# Patient Record
Sex: Female | Born: 1970 | Race: Black or African American | Hispanic: No | Marital: Single | Smoking: Never smoker
Health system: Southern US, Community
[De-identification: ages and names within clinical notes are randomized; demographics above are authoritative.]

## PROBLEM LIST (undated history)

## (undated) DIAGNOSIS — G43909 Migraine, unspecified, not intractable, without status migrainosus: Secondary | ICD-10-CM

## (undated) DIAGNOSIS — R569 Unspecified convulsions: Secondary | ICD-10-CM

## (undated) DIAGNOSIS — M359 Systemic involvement of connective tissue, unspecified: Secondary | ICD-10-CM

## (undated) DIAGNOSIS — M329 Systemic lupus erythematosus, unspecified: Secondary | ICD-10-CM

## (undated) DIAGNOSIS — D573 Sickle-cell trait: Secondary | ICD-10-CM

## (undated) DIAGNOSIS — IMO0002 Reserved for concepts with insufficient information to code with codable children: Secondary | ICD-10-CM

## (undated) DIAGNOSIS — E785 Hyperlipidemia, unspecified: Secondary | ICD-10-CM

## (undated) DIAGNOSIS — D649 Anemia, unspecified: Secondary | ICD-10-CM

## (undated) DIAGNOSIS — K589 Irritable bowel syndrome without diarrhea: Secondary | ICD-10-CM

## (undated) HISTORY — DX: Unspecified convulsions: R56.9

## (undated) HISTORY — PX: SMALL INTESTINE SURGERY: SHX150

## (undated) HISTORY — DX: Hyperlipidemia, unspecified: E78.5

## (undated) HISTORY — PX: ABDOMINAL HYSTERECTOMY: SHX81

## (undated) HISTORY — PX: ABDOMINAL SURGERY: SHX537

## (undated) HISTORY — PX: ABDOMINAL HYSTERECTOMY: SUR658

---

## 1989-08-29 HISTORY — PX: CHOLECYSTECTOMY: SHX55

## 2013-08-13 ENCOUNTER — Emergency Department (INDEPENDENT_AMBULATORY_CARE_PROVIDER_SITE_OTHER)
Admission: EM | Admit: 2013-08-13 | Discharge: 2013-08-13 | Disposition: A | Discharge status: Home / Self Care | Payer: Self-pay | Source: Home / Self Care | Attending: Family Medicine | Admitting: Family Medicine

## 2013-08-13 ENCOUNTER — Encounter (HOSPITAL_COMMUNITY): Payer: Self-pay | Admitting: Emergency Medicine

## 2013-08-13 DIAGNOSIS — M79602 Pain in left arm: Secondary | ICD-10-CM

## 2013-08-13 DIAGNOSIS — R109 Unspecified abdominal pain: Secondary | ICD-10-CM

## 2013-08-13 DIAGNOSIS — M79609 Pain in unspecified limb: Secondary | ICD-10-CM

## 2013-08-13 LAB — POCT URINALYSIS DIP (DEVICE)
Ketones, ur: NEGATIVE mg/dL
Leukocytes, UA: NEGATIVE
Protein, ur: NEGATIVE mg/dL
Specific Gravity, Urine: 1.02 (ref 1.005–1.030)
Urobilinogen, UA: 0.2 mg/dL (ref 0.0–1.0)
pH: 6 (ref 5.0–8.0)

## 2013-08-13 NOTE — ED Notes (Signed)
C/o left sided flank pain and headache x 3 wks.  Pt states that she felt like the flank pain was from gas. Pt has tried gas pills with short term relief but flank pain returned.  Denies fever, n/v/d

## 2013-08-13 NOTE — ED Provider Notes (Signed)
Medical screening examination/treatment/procedure(s) were performed by resident physician or non-physician practitioner and as supervising physician I was immediately available for consultation/collaboration.   Llewellyn Choplin DOUGLAS MD.   Eylin Pontarelli D Ahja Martello, MD 08/13/13 2015 
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
## 2013-08-14 ENCOUNTER — Emergency Department (HOSPITAL_COMMUNITY): Payer: Self-pay

## 2013-08-14 ENCOUNTER — Encounter (HOSPITAL_COMMUNITY): Payer: Self-pay | Admitting: Emergency Medicine

## 2013-08-14 ENCOUNTER — Emergency Department (HOSPITAL_COMMUNITY)
Admission: EM | Admit: 2013-08-14 | Discharge: 2013-08-14 | Disposition: A | Discharge status: Home / Self Care | Payer: Self-pay | Attending: Emergency Medicine | Admitting: Emergency Medicine

## 2013-08-14 DIAGNOSIS — R109 Unspecified abdominal pain: Secondary | ICD-10-CM | POA: Insufficient documentation

## 2013-08-14 DIAGNOSIS — M79609 Pain in unspecified limb: Secondary | ICD-10-CM | POA: Insufficient documentation

## 2013-08-14 DIAGNOSIS — Z9089 Acquired absence of other organs: Secondary | ICD-10-CM | POA: Insufficient documentation

## 2013-08-14 DIAGNOSIS — Z3202 Encounter for pregnancy test, result negative: Secondary | ICD-10-CM | POA: Insufficient documentation

## 2013-08-14 DIAGNOSIS — Z862 Personal history of diseases of the blood and blood-forming organs and certain disorders involving the immune mechanism: Secondary | ICD-10-CM | POA: Insufficient documentation

## 2013-08-14 DIAGNOSIS — R51 Headache: Secondary | ICD-10-CM | POA: Insufficient documentation

## 2013-08-14 LAB — URINALYSIS, ROUTINE W REFLEX MICROSCOPIC
Bilirubin Urine: NEGATIVE
Glucose, UA: NEGATIVE mg/dL
Ketones, ur: NEGATIVE mg/dL
Leukocytes, UA: NEGATIVE
Protein, ur: NEGATIVE mg/dL
Urobilinogen, UA: 0.2 mg/dL (ref 0.0–1.0)

## 2013-08-14 LAB — CBC WITH DIFFERENTIAL/PLATELET
Basophils Absolute: 0 10*3/uL (ref 0.0–0.1)
Basophils Relative: 1 % (ref 0–1)
Eosinophils Absolute: 0.2 10*3/uL (ref 0.0–0.7)
Eosinophils Relative: 3 % (ref 0–5)
HCT: 34.7 % — ABNORMAL LOW (ref 36.0–46.0)
Lymphocytes Relative: 27 % (ref 12–46)
Lymphs Abs: 1.6 10*3/uL (ref 0.7–4.0)
MCH: 25.1 pg — ABNORMAL LOW (ref 26.0–34.0)
MCHC: 33.1 g/dL (ref 30.0–36.0)
MCV: 75.8 fL — ABNORMAL LOW (ref 78.0–100.0)
Monocytes Absolute: 0.4 10*3/uL (ref 0.1–1.0)
Monocytes Relative: 7 % (ref 3–12)
Neutrophils Relative %: 63 % (ref 43–77)
Platelets: 338 10*3/uL (ref 150–400)
RDW: 14.7 % (ref 11.5–15.5)
WBC: 6.1 10*3/uL (ref 4.0–10.5)

## 2013-08-14 LAB — URINE MICROSCOPIC-ADD ON

## 2013-08-14 LAB — BASIC METABOLIC PANEL
CO2: 26 mEq/L (ref 19–32)
Calcium: 9.1 mg/dL (ref 8.4–10.5)
Creatinine, Ser: 0.53 mg/dL (ref 0.50–1.10)
GFR calc non Af Amer: >90 mL/min (ref >90)
Sodium: 138 mEq/L (ref 135–145)

## 2013-08-14 LAB — PREGNANCY, URINE: Preg Test, Ur: NEGATIVE

## 2013-08-14 MED ORDER — FENTANYL CITRATE 0.05 MG/ML IJ SOLN
100.0000 ug | INTRAMUSCULAR | Status: DC | PRN
  Administered 2013-08-14: 100 ug via INTRAVENOUS
  Filled 2013-08-14: qty 2

## 2013-08-14 MED ORDER — HYDROCODONE-ACETAMINOPHEN 5-325 MG PO TABS: 1.0000 | ORAL_TABLET | ORAL | Status: DC | PRN

## 2013-08-14 MED ORDER — TOPIRAMATE 25 MG PO TABS: 25.0000 mg | ORAL_TABLET | Freq: Every day | ORAL | Status: DC

## 2013-08-14 MED ORDER — SODIUM CHLORIDE 0.9 % IV SOLN
INTRAVENOUS | Status: DC
  Administered 2013-08-14: 10:00:00 via INTRAVENOUS

## 2013-08-14 NOTE — ED Provider Notes (Signed)
CSN: [id number]     Arrival date & time 08/14/13  0818 History     First MD Initiated Contact with Patient 08/14/13 [phone]       Chief Complaint   Patient presents with   .  Hip Pain   .  Arm Pain   .  Headache      (Consider location/radiation/quality/duration/timing/severity/associated sxs/prior Treatment) HPI Comments: Rachel Hammond is a 42 y.o. female who is here for evaluation of left flank pain, and headache. Both problems have been present for 3 weeks. Both problems are intermittent. She was evaluated yesterday at the urgent care and told to come here, however, she decided to leave AGAINST MEDICAL ADVICE and to  return here today. The pain waxes and wanes. It is present almost all the time she is awake. She is using over-the-counter Aleve, without relief. She denies dysuria, urinary frequency, fever, chills, nausea, vomiting, anorexia, cough, shortness of breath, or chest pain. There are no other known modifying factors.     Patient is a 42 y.o. female presenting with hip pain, arm pain, and headaches. The history is provided by the patient.  Hip Pain Associated symptoms include headaches.  Arm Pain Associated symptoms include headaches.  Headache      Past Medical History   Diagnosis  Date   .  Sickle cell anemia         Past Surgical History   Procedure  Laterality  Date   .  Cholecystectomy       .  Cesarean section          History reviewed. No pertinent family history. History   Substance Use Topics   .  Smoking status:  Never Smoker    .  Smokeless tobacco:  Not on file   .  Alcohol Use:  No       OB History     Grav  Para  Term  Preterm  Abortions  TAB  SAB  Ect  Mult  Living                                Review of Systems  Neurological: Positive for headaches.  All other systems reviewed and are negative.        Allergies    Codeine    Home Medications       Current Outpatient Rx   Name    Route    Sig    Dispense    Refill   .   naproxen sodium (ANAPROX) 220 MG tablet     Oral     Take 440 mg by mouth every 6 (six) hours as needed (for pain).                Marland Kitchen  HYDROcodone-acetaminophen (NORCO) 5-325 MG per tablet     Oral     Take 1 tablet by mouth every 4 (four) hours as needed.     20 tablet     0       BP 95/64  Pulse 58  Temp(Src) 98.3 F (36.8 C) (Oral)  Resp 16  SpO2 100%  LMP 08/03/2013 Physical Exam  Nursing note and vitals reviewed. Constitutional: She is oriented to person, place, and time. She appears well-developed and well-nourished.  HENT:   Head: Normocephalic and atraumatic.  Right Ear: External ear normal.  Left Ear: External ear normal.  Eyes: Conjunctivae and EOM are normal. Pupils  are equal, round, and reactive to light.  Neck: Normal range of motion and phonation normal. Neck supple.  Cardiovascular: Normal rate, regular rhythm, normal heart sounds and intact distal pulses.   Pulmonary/Chest: Effort normal and breath sounds normal. She exhibits no bony tenderness.  Abdominal: Soft. Normal appearance. There is mild left upper and lower quadrant tenderness. There is no rebound tenderness, mass or guarding  Musculoskeletal: Normal range of motion.  Neurological: She is alert and oriented to person, place, and time. No cranial nerve deficit or sensory deficit. She exhibits normal muscle tone. Coordination normal.  Skin: Skin is warm, dry and intact.  Psychiatric: She has a normal mood and affect. Her behavior is normal. Judgment and thought content normal.       ED Course    Medications  0.9 %  sodium chloride infusion ( Intravenous Stopped 08/14/13 1516)  fentaNYL (SUBLIMAZE) injection 100 mcg (100 mcg Intravenous Given 08/14/13 0951)    Patient Vitals for the past 24 hrs:  BP Temp Temp src Pulse Resp SpO2  08/14/13 1513 95/64 mmHg - - 58 - 100 %  08/14/13 1227 96/64 mmHg - - 64 16 100 %  08/14/13 1122 93/77 mmHg - - 56 15 100 %  08/14/13 0826 95/59 mmHg 98.3 F (36.8  C) Oral 69 20 100 %   3:15 PM Reevaluation with update and discussion. After initial assessment and treatment, an updated evaluation reveals She is comfortable now. [name] L   Procedures (including critical care time) Labs Review Labs Reviewed   CBC WITH DIFFERENTIAL - Abnormal; Notable for the following:      Hemoglobin  11.5 (*)       HCT  34.7 (*)       MCV  75.8 (*)       MCH  25.1 (*)       All other components within normal limits   URINALYSIS, ROUTINE W REFLEX MICROSCOPIC - Abnormal; Notable for the following:      APPearance  CLOUDY (*)       Hgb urine dipstick  TRACE (*)       All other components within normal limits   URINE MICROSCOPIC-ADD ON - Abnormal; Notable for the following:      Squamous Epithelial / LPF  FEW (*)       All other components within normal limits   BASIC METABOLIC PANEL   PREGNANCY, URINE      Imaging Review Ct Abdomen Pelvis Wo Contrast   08/14/2013   CLINICAL DATA:  Left-sided flank pain  EXAM: CT ABDOMEN AND PELVIS WITHOUT CONTRAST  TECHNIQUE: Multidetector CT imaging of the abdomen and pelvis was performed following the standard protocol without intravenous contrast.  COMPARISON:  None.  FINDINGS: The lung bases are free of acute infiltrate or sizable effusion.  The gallbladder is been surgically removed. The liver, spleen, adrenal glands and pancreas are all normal in their CT appearance. The kidneys are well visualized bilaterally without evidence of renal calculi or urinary tract obstructive changes. The appendix is not well visualized although no right lower quadrant inflammatory changes to suggest appendicitis are noted. The bladder is partially distended with non-opacified urine. The uterus appears within normal limits. A left-sided ovarian cyst is seen which measures 2.7 cm in greatest dimension. No free pelvic fluid is noted. No other focal abnormality is seen.  IMPRESSION: Left ovarian cyst without complicating factors.   Nonvisualization of the appendix although no inflammatory changes are seen.  No other focal abnormality  is noted.   Electronically Signed   By: Alcide Clever M.D.   On: 08/14/2013 14:00        EKG Interpretation       Date/Time:                  Wednesday August 14 2013 08:20:27 EST Ventricular Rate:         67 PR Interval:                 160 QRS Duration:            72 QT Interval:                 398 QTC Calculation:        420 R Axis:                         60 Text Interpretation:       Normal sinus rhythm Possible Left atrial enlargement Septal infarct , age undetermined Abnormal ECG No old tracing to compare Confirmed by [facility]  MD, Brae Gartman ([id number]) on 08/14/2013 8:28:41 AM                       MDM       1.  Flank pain       Nonspecific flank pain with negative EP evaluation. Doubt urinary tract infection, pyelonephritis, nephrolithiasis, ureteral colic, serious bacterial infection, or metabolic instability  Nursing Notes Reviewed/ Care Coordinated, and agree without changes. Applicable Imaging Reviewed.  Interpretation of Laboratory Data incorporated into ED treatment    Plan: Home Medications- Norco; Home Treatments and Observation- rest; return here if the recommended treatment, does not improve the symptoms; Recommended follow up- PCP of choice for f/u care in 1-2 weeks     After D/C, She requested a refill on her Topamax, 100mg  BID, as prophylaxis, for headaches. Prior doses. She stopped taking 6 months ago. She does not have a doctor yet in [place]. I consented to give her a prescription for Topamax 25 mg, #10 to take at bedtime. She will need to followup with primary care doctor or neurologist, for further prophylactic treatment for headaches.      Flint Melter, MD 08/14/13 1600

## 2013-08-14 NOTE — ED Notes (Signed)
Pt c/o left flank pain into left hip and left arm pain with HA x 3 weeks

## 2013-08-14 NOTE — ED Notes (Signed)
Pt getting undressed and into a gown at this time; 2 blankets given

## 2013-08-14 NOTE — ED Notes (Signed)
Pt up ambulatory to the bathroom at this time attempting to provide an urine specimen 

## 2013-08-14 NOTE — ED Notes (Signed)
Dr Effie Shy made aware of delay of CT. Urine preg resulted as negative.

## 2013-08-14 NOTE — ED Notes (Addendum)
Was seen at [facility] yesterday -- has had left flank pain radiating to left lower abd-- had U/A yestereday and was told to come to ED-- was unable to wait, came back today.  Recently moved here [place], has no private MD--looking for primary care doctor-- has had hx of migraines on topamax, is out of topamax--having headaches currently. Started new job, under a lot of stress per patient.

## 2013-08-14 NOTE — ED Notes (Signed)
Pt made aware of need of urine specimen; pt cannot go at this time

## 2013-09-27 ENCOUNTER — Encounter (HOSPITAL_COMMUNITY): Payer: Self-pay | Admitting: Emergency Medicine

## 2013-09-27 ENCOUNTER — Emergency Department (HOSPITAL_COMMUNITY)
Admission: EM | Admit: 2013-09-27 | Discharge: 2013-09-27 | Disposition: A | Discharge status: Home / Self Care | Payer: Medicaid Other | Attending: Emergency Medicine | Admitting: Emergency Medicine

## 2013-09-27 DIAGNOSIS — Z79899 Other long term (current) drug therapy: Secondary | ICD-10-CM | POA: Insufficient documentation

## 2013-09-27 DIAGNOSIS — R51 Headache: Secondary | ICD-10-CM | POA: Insufficient documentation

## 2013-09-27 DIAGNOSIS — Z862 Personal history of diseases of the blood and blood-forming organs and certain disorders involving the immune mechanism: Secondary | ICD-10-CM | POA: Insufficient documentation

## 2013-09-27 DIAGNOSIS — R519 Headache, unspecified: Secondary | ICD-10-CM

## 2013-09-27 MED ORDER — DIPHENHYDRAMINE HCL 50 MG/ML IJ SOLN
25.0000 mg | Freq: Once | INTRAMUSCULAR | Status: AC
  Administered 2013-09-27: 25 mg via INTRAVENOUS
  Filled 2013-09-27: qty 1

## 2013-09-27 MED ORDER — SODIUM CHLORIDE 0.9 % IV SOLN
Freq: Once | INTRAVENOUS | Status: AC
  Administered 2013-09-27: 21:00:00 via INTRAVENOUS

## 2013-09-27 MED ORDER — KETOROLAC TROMETHAMINE 60 MG/2ML IM SOLN: 60.0000 mg | Freq: Once | INTRAMUSCULAR | Status: DC

## 2013-09-27 MED ORDER — DIPHENHYDRAMINE HCL 50 MG/ML IJ SOLN: 25.0000 mg | Freq: Once | INTRAMUSCULAR | Status: DC

## 2013-09-27 MED ORDER — METOCLOPRAMIDE HCL 5 MG/ML IJ SOLN
10.0000 mg | Freq: Once | INTRAMUSCULAR | Status: AC
Start: 2013-09-27 — End: 2013-09-27
  Administered 2013-09-27: 10 mg via INTRAVENOUS
  Filled 2013-09-27: qty 2

## 2013-09-27 MED ORDER — METOCLOPRAMIDE HCL 5 MG/ML IJ SOLN: 10.0000 mg | Freq: Once | INTRAMUSCULAR | Status: DC

## 2013-09-27 MED ORDER — KETOROLAC TROMETHAMINE 30 MG/ML IJ SOLN
30.0000 mg | Freq: Once | INTRAMUSCULAR | Status: AC
Start: 2013-09-27 — End: 2013-09-27
  Administered 2013-09-27: 30 mg via INTRAVENOUS
  Filled 2013-09-27: qty 1

## 2013-09-27 MED ORDER — SUMATRIPTAN SUCCINATE 100 MG PO TABS: 100.0000 mg | ORAL_TABLET | ORAL | Status: DC | PRN

## 2013-09-27 NOTE — ED Provider Notes (Signed)
CSN: [id number]     Arrival date & time 09/27/13  [id number] History   First MD Initiated Contact with Patient 09/27/13 1954     Chief Complaint  Patient presents with  . Headache   (Consider location/radiation/quality/duration/timing/severity/associated sxs/prior Treatment) Patient is a 43 y.o. female presenting with headaches. The history is provided by the patient.  Headache Pain location:  Frontal Quality:  Sharp Radiates to:  L neck and R neck Onset quality:  Gradual Duration:  3 days Timing:  Constant Progression:  Worsening Chronicity:  New Similar to prior headaches: no   Relieved by:  Nothing Worsened by:  Nothing tried Ineffective treatments:  Prescription medications Risk factors: no anger   Pt complains of a headache.   Pt has had headaches since 08.   Pt reports headache are infrequent.   Pt reports topamax no longer works  Past Medical History  Diagnosis Date  . Sickle cell anemia    Past Surgical History  Procedure Laterality Date  . Cholecystectomy    . Cesarean section     History reviewed. No pertinent family history. History  Substance Use Topics  . Smoking status: Never Smoker   . Smokeless tobacco: Not on file  . Alcohol Use: No   OB History   Grav Para Term Preterm Abortions TAB SAB Ect Mult Living                 Review of Systems  Neurological: Positive for headaches.  All other systems reviewed and are negative.    Allergies  Codeine  Home Medications   Current Outpatient Rx  Name  Route  Sig  Dispense  Refill  . HYDROcodone-acetaminophen (NORCO) 5-325 MG per tablet   Oral   Take 1 tablet by mouth every 4 (four) hours as needed.   20 tablet   0   . naproxen sodium (ANAPROX) 220 MG tablet   Oral   Take 440 mg by mouth every 6 (six) hours as needed (for pain).         . topiramate (TOPAMAX) 25 MG tablet   Oral   Take 1 tablet (25 mg total) by mouth at bedtime.   10 tablet   0    BP 104/72  Pulse 72  Temp(Src) 97.9 F  (36.6 C) (Oral)  Resp 20  Wt 132 lb 8 oz (60.102 kg)  SpO2 99%  LMP 09/27/2013 Physical Exam  Vitals reviewed. Constitutional: She is oriented to person, place, and time. She appears well-developed and well-nourished.  HENT:  Head: Normocephalic.  Right Ear: External ear normal.  Left Ear: External ear normal.  Nose: Nose normal.  Mouth/Throat: Oropharynx is clear and moist.  Eyes: Pupils are equal, round, and reactive to light.  Neck: Normal range of motion. Neck supple.  Cardiovascular: Normal rate and normal heart sounds.   Pulmonary/Chest: Effort normal and breath sounds normal.  Abdominal: Soft. Bowel sounds are normal.  Musculoskeletal: Normal range of motion.  Neurological: She is alert and oriented to person, place, and time. She has normal reflexes.  Skin: Skin is warm.  Psychiatric: She has a normal mood and affect.    ED Course  Procedures (including critical care time) Labs Review Labs Reviewed - No data to display Imaging Review No results found.  EKG Interpretation   None       MDM Rx for imitrex   1. Headache    Pt given Iv fluids, torodol, reglan and benadryl.   Pt reports headache completely resolved  Lonia Skinner [place], PA-C 09/27/13 2232

## 2013-09-27 NOTE — ED Notes (Signed)
Pt reports she developed a headache about 3 days ago that has been constant. Reports she has been taking Naproxen without any relief. Denies any nausea/vomiting.

## 2013-09-27 NOTE — Discharge Instructions (Signed)

## 2013-09-29 NOTE — ED Provider Notes (Signed)
Medical screening examination/treatment/procedure(s) were performed by non-physician practitioner and as supervising physician I was immediately available for consultation/collaboration.  EKG Interpretation   None        Juliet RudeNathan R. Rubin PayorPickering, MD 09/29/13 [phone]

## 2013-11-17 ENCOUNTER — Emergency Department (HOSPITAL_COMMUNITY)
Admission: EM | Admit: 2013-11-17 | Discharge: 2013-11-18 | Disposition: A | Discharge status: Home / Self Care | Payer: Medicaid Other | Attending: Emergency Medicine | Admitting: Emergency Medicine

## 2013-11-17 ENCOUNTER — Encounter (HOSPITAL_COMMUNITY): Payer: Self-pay | Admitting: Emergency Medicine

## 2013-11-17 ENCOUNTER — Emergency Department (HOSPITAL_COMMUNITY)
Admission: EM | Admit: 2013-11-17 | Discharge: 2013-11-17 | Disposition: A | Discharge status: Nursing Home (Includes ICF) | Payer: Medicaid Other | Source: Home / Self Care

## 2013-11-17 DIAGNOSIS — M542 Cervicalgia: Secondary | ICD-10-CM

## 2013-11-17 DIAGNOSIS — R51 Headache: Secondary | ICD-10-CM | POA: Insufficient documentation

## 2013-11-17 DIAGNOSIS — J3489 Other specified disorders of nose and nasal sinuses: Secondary | ICD-10-CM | POA: Insufficient documentation

## 2013-11-17 DIAGNOSIS — Z87828 Personal history of other (healed) physical injury and trauma: Secondary | ICD-10-CM | POA: Insufficient documentation

## 2013-11-17 DIAGNOSIS — R519 Headache, unspecified: Secondary | ICD-10-CM

## 2013-11-17 DIAGNOSIS — M549 Dorsalgia, unspecified: Secondary | ICD-10-CM

## 2013-11-17 DIAGNOSIS — Z862 Personal history of diseases of the blood and blood-forming organs and certain disorders involving the immune mechanism: Secondary | ICD-10-CM | POA: Insufficient documentation

## 2013-11-17 DIAGNOSIS — R291 Meningismus: Secondary | ICD-10-CM

## 2013-11-17 DIAGNOSIS — Z79899 Other long term (current) drug therapy: Secondary | ICD-10-CM | POA: Insufficient documentation

## 2013-11-17 HISTORY — DX: Sickle-cell trait: D57.3

## 2013-11-17 HISTORY — DX: Anemia, unspecified: D64.9

## 2013-11-17 MED ORDER — METOCLOPRAMIDE HCL 5 MG/ML IJ SOLN: 10.0000 mg | Freq: Once | INTRAMUSCULAR | Status: DC

## 2013-11-17 MED ORDER — HYDROCODONE-ACETAMINOPHEN 5-325 MG PO TABS: ORAL_TABLET | ORAL | Status: DC

## 2013-11-17 MED ORDER — HYDROCODONE-ACETAMINOPHEN 5-325 MG PO TABS
2.0000 | ORAL_TABLET | Freq: Once | ORAL | Status: AC
  Administered 2013-11-17: 2 via ORAL
  Filled 2013-11-17: qty 2

## 2013-11-17 MED ORDER — METHOCARBAMOL 500 MG PO TABS: 1000.0000 mg | ORAL_TABLET | Freq: Four times a day (QID) | ORAL | Status: DC

## 2013-11-17 MED ORDER — METHOCARBAMOL 500 MG PO TABS
1000.0000 mg | ORAL_TABLET | Freq: Once | ORAL | Status: AC
  Administered 2013-11-17: 1000 mg via ORAL
  Filled 2013-11-17: qty 2

## 2013-11-17 MED ORDER — DIPHENHYDRAMINE HCL 50 MG/ML IJ SOLN: 12.5000 mg | Freq: Once | INTRAMUSCULAR | Status: DC

## 2013-11-17 NOTE — ED Notes (Addendum)
Pt reports neck pain with movement; unable to touch chin to chest due to pain. Started suddenly around 1500 today. Afebrile. Chills reports. A&Ox3. Denies other symptoms except runny nose.  Is seeing cardiologist for rapid heart beat; been wearing halter monitor; to have echo this month. Pt unaware of actual dx.

## 2013-11-17 NOTE — ED Provider Notes (Signed)
Medical screening examination/treatment/procedure(s) were performed by resident physician or non-physician practitioner and as supervising physician I was immediately available for consultation/collaboration.   Kambryn Dapolito DOUGLAS MD.   Levester Waldridge D Zhavia Cunanan, MD 11/17/13 1958 

## 2013-11-17 NOTE — ED Provider Notes (Signed)
CSN: [id number]     Arrival date & time 11/17/13  1819 History   None    Chief Complaint  Patient presents with  . Neck Pain  . Back Pain   (Consider location/radiation/quality/duration/timing/severity/associated sxs/prior Treatment) HPI Comments: 43 year old female presents for evaluation of acute onset of neck and back pain and severe headache that started today at 3 PM, about 4 hours ago. The pain is severe, rated 9/10 in severity. She says her neck feels very stiff also. She denies any other symptoms at this time. No fever, nausea, vomiting. She has a history of sickle cell trait but not sickle cell anemia.  Patient is a 43 y.o. female presenting with neck pain and back pain.  Neck Pain Associated symptoms: headaches   Associated symptoms: no chest pain, no fever and no weakness   Back Pain Associated symptoms: headaches   Associated symptoms: no abdominal pain, no chest pain, no dysuria, no fever and no weakness     Past Medical History  Diagnosis Date  . Sickle cell trait   . Anemia    Past Surgical History  Procedure Laterality Date  . Cesarean section  1993 and 1998    x 2  . Cholecystectomy  1991   Family History  Problem Relation Age of Onset  . Diabetes Mother     gangrene  . Cancer Father     brain   History  Substance Use Topics  . Smoking status: Never Smoker   . Smokeless tobacco: Not on file  . Alcohol Use: No   OB History   Grav Para Term Preterm Abortions TAB SAB Ect Mult Living                 Review of Systems  Constitutional: Negative for fever and chills.  Eyes: Negative for visual disturbance.  Respiratory: Negative for cough and shortness of breath.   Cardiovascular: Negative for chest pain, palpitations and leg swelling.  Gastrointestinal: Negative for nausea, vomiting and abdominal pain.  Endocrine: Negative for polydipsia and polyuria.  Genitourinary: Negative for dysuria, urgency and frequency.  Musculoskeletal: Positive for back  pain, neck pain and neck stiffness. Negative for arthralgias and myalgias.  Skin: Negative for rash.  Neurological: Positive for headaches. Negative for dizziness, weakness and light-headedness.    Allergies  Codeine  Home Medications   Current Outpatient Rx  Name  Route  Sig  Dispense  Refill  . Cholecalciferol (VITAMIN D-3 PO)   Oral   Take 1,000 Units by mouth.         Marland Kitchen. HYDROcodone-acetaminophen (NORCO) 5-325 MG per tablet   Oral   Take 1 tablet by mouth every 4 (four) hours as needed.   20 tablet   0   . propranolol (INDERAL) 20 MG tablet   Oral   Take 20 mg by mouth 2 (two) times daily.         Marland Kitchen. topiramate (TOPAMAX) 25 MG tablet   Oral   Take 100 mg by mouth 2 (two) times daily.         . naproxen sodium (ANAPROX) 220 MG tablet   Oral   Take 440 mg by mouth every 6 (six) hours as needed (for pain).         . SUMAtriptan (IMITREX) 100 MG tablet   Oral   Take 1 tablet (100 mg total) by mouth every 2 (two) hours as needed for migraine or headache. May repeat in 2 hours if headache persists or recurs.  10 tablet   0    BP 109/71  Pulse 64  Temp(Src) 98.2 F (36.8 C) (Oral)  Resp 17  SpO2 100%  LMP 10/29/2013 Physical Exam  Nursing note and vitals reviewed. Constitutional: She is oriented to person, place, and time. Vital signs are normal. She appears well-developed and well-nourished. No distress.  HENT:  Head: Normocephalic and atraumatic.  Neck: Spinous process tenderness and muscular tenderness present. Rigidity present. Decreased range of motion present.  Nuchal rigidity passively and actively   Pulmonary/Chest: Effort normal. No respiratory distress.  Neurological: She is alert and oriented to person, place, and time. She has normal strength. Coordination normal.  Skin: Skin is warm and dry. No rash noted. She is not diaphoretic.  Psychiatric: She has a normal mood and affect. Judgment normal.    ED Course  Procedures (including critical  care time) Labs Review Labs Reviewed - No data to display Imaging Review No results found.   MDM   1. Nuchal rigidity   2. Headache   3. Back pain   4. Neck pain    True nuchal rigidity, needs prompt eval in ED, need to r/o meningitis.  Transferred via shuttle     Graylon Good, PA-C 11/17/13 1937

## 2013-11-17 NOTE — ED Notes (Signed)
C/o neck, back pain and headache onset 1500.  No fever.

## 2013-11-17 NOTE — ED Provider Notes (Signed)
CSN: [id number]     Arrival date & time 11/17/13  1941 History   First MD Initiated Contact with Patient 11/17/13 2100     Chief Complaint  Patient presents with  . Neck Pain     (Consider location/radiation/quality/duration/timing/severity/associated sxs/prior Treatment) HPI Comments: Patient presents with complaint of neck pain and headache which began approximately 3 PM while her fiance was rubbing her shoulders. Afterward she developed a headache. HA did not not start prior to or with the neck pain. She denies fever. She has had a runny nose recently, but other no medical complaints. No nausea, vomiting, diarrhea. No vision change or vision loss. Pain is worse with palpation of her neck and with movement. No recent injections into neck or chiropractor manipulation. No injury or heavy lifting. No treatments PTA. Send to ED from [facility]. The onset of this condition was acute. The course is constant. Alleviating factors: none.     Patient is a 43 y.o. female presenting with neck pain. The history is provided by the patient.  Neck Pain Associated symptoms: headaches   Associated symptoms: no chest pain, no fever, no numbness, no photophobia and no weakness     Past Medical History  Diagnosis Date  . Sickle cell trait   . Anemia    Past Surgical History  Procedure Laterality Date  . Cesarean section  1993 and 1998    x 2  . Cholecystectomy  1991   Family History  Problem Relation Age of Onset  . Diabetes Mother     gangrene  . Cancer Father     brain   History  Substance Use Topics  . Smoking status: Never Smoker   . Smokeless tobacco: Not on file  . Alcohol Use: No   OB History   Grav Para Term Preterm Abortions TAB SAB Ect Mult Living                 Review of Systems  Constitutional: Negative for fever.  HENT: Positive for rhinorrhea. Negative for congestion, dental problem and sinus pressure.   Eyes: Negative for photophobia, discharge, redness and visual  disturbance.  Respiratory: Negative for shortness of breath.   Cardiovascular: Negative for chest pain.  Gastrointestinal: Negative for nausea and vomiting.  Musculoskeletal: Positive for neck pain and neck stiffness. Negative for gait problem.  Skin: Negative for rash.  Neurological: Positive for headaches. Negative for syncope, speech difficulty, weakness, light-headedness and numbness.  Psychiatric/Behavioral: Negative for confusion.   Allergies  Codeine  Home Medications   Current Outpatient Rx  Name  Route  Sig  Dispense  Refill  . Cholecalciferol (VITAMIN D-3 PO)   Oral   Take 1,000 Units by mouth.         . naproxen sodium (ANAPROX) 220 MG tablet   Oral   Take 440 mg by mouth every 6 (six) hours as needed (for pain).         . propranolol (INDERAL) 20 MG tablet   Oral   Take 20 mg by mouth 2 (two) times daily.         Marland Kitchen topiramate (TOPAMAX) 25 MG tablet   Oral   Take 100 mg by mouth 2 (two) times daily.          BP 93/58  Pulse 60  Temp(Src) 98.3 F (36.8 C) (Oral)  Resp 16  Ht 4\' 11"  (1.499 m)  Wt 126 lb (57.153 kg)  BMI 25.44 kg/m2  SpO2 100%  LMP 10/29/2013  Physical  Exam  Nursing note and vitals reviewed. Constitutional: She is oriented to person, place, and time. She appears well-developed and well-nourished.  HENT:  Head: Normocephalic and atraumatic. Head is without raccoon's eyes and without Battle's sign.  Right Ear: Tympanic membrane, external ear and ear canal normal. No hemotympanum.  Left Ear: Tympanic membrane, external ear and ear canal normal. No hemotympanum.  Nose: Nose normal. No nasal septal hematoma.  Mouth/Throat: Uvula is midline, oropharynx is clear and moist and mucous membranes are normal.  Eyes: Conjunctivae, EOM and lids are normal. Pupils are equal, round, and reactive to light. Right eye exhibits no discharge. Left eye exhibits no discharge. Right eye exhibits no nystagmus. Left eye exhibits no nystagmus.  No visible  hyphema noted  Neck: Trachea normal and normal range of motion. Neck supple. Normal carotid pulses and no JVD present. Muscular tenderness present. No spinous process tenderness present. Carotid bruit is not present. No rigidity. No edema, no erythema and normal range of motion (normal active ROM with pain) present. No Brudzinski's sign and no Kernig's sign noted.  Active lateral rotation: normal. Hyperextension: normal with pain. Flexion: slow but she can fully flex with pain.   Cardiovascular: Normal rate, regular rhythm and normal heart sounds.   No murmur heard. Pulmonary/Chest: Effort normal and breath sounds normal.  Abdominal: Soft. There is no tenderness.  Musculoskeletal:       Cervical back: She exhibits tenderness. She exhibits normal range of motion and no bony tenderness.       Thoracic back: She exhibits tenderness. She exhibits normal range of motion and no bony tenderness.       Lumbar back: She exhibits no tenderness and no bony tenderness.  Patient has tenderness with lightest touch of cervical paraspinous musculature.   Neurological: She is alert and oriented to person, place, and time. She has normal strength and normal reflexes. No cranial nerve deficit or sensory deficit. She displays a negative Romberg sign. Coordination and gait normal. GCS eye subscore is 4. GCS verbal subscore is 5. GCS motor subscore is 6.  Skin: Skin is warm and dry.  Psychiatric: She has a normal mood and affect.    ED Course  Procedures (including critical care time) Labs Review Labs Reviewed - No data to display Imaging Review No results found.   EKG Interpretation None      9:52 PM Patient seen and examined. Exam highly suggestive of musculoskeletal neck pain. Will discuss with Dr. Effie ShyWentz.   Vital signs reviewed and are as follows: Filed Vitals:   11/17/13 2130  BP: 93/58  Pulse: 60  Temp:   Resp:   BP 93/58  Pulse 60  Temp(Src) 98.3 F (36.8 C) (Oral)  Resp 16  Ht 4\' 11"   (1.499 m)  Wt 126 lb (57.153 kg)  BMI 25.44 kg/m2  SpO2 100%  LMP 10/29/2013  Dr. Effie ShyWentz has seen. Will treat pain. Pt aware and agrees. Will d/c to home with symptomatic treatment.   Patient urged to return with worsening symptoms or other concerns. Patient verbalized understanding and agrees with plan.   11:35 PM Patient continues to do well. No signs of neurological deterioration.   Patient was counseled on head injury precautions and symptoms that should indicate their return to the ED. These include severe worsening headache, vision changes, confusion, loss of consciousness, trouble walking, nausea & vomiting, or weakness/tingling in extremities.    Patient counseled on use of narcotic pain medications. Counseled not to combine these medications with others  containing tylenol. Urged not to drink alcohol, drive, or perform any other activities that requires focus while taking these medications. The patient verbalizes understanding and agrees with the plan.  Patient counseled on proper use of muscle relaxant medication.  They were told not to drink alcohol, drive any vehicle, or do any dangerous activities while taking this medication.  Patient verbalized understanding.  MDM   Final diagnoses:  Neck pain   Patient with neck pain. Per exam above, pain is very suspicious for musculoskeletal pain given pain with slight movement or light palpation of paraspinous musculature.   Do not suspect meningitis given no fever. Patient does not have meningismus. She has a normal neurological exam.   Considered intracranial bleeding. Neck pain started before HA. Again, patient does not have meningismus. She has a normal neurological exam. Sentinel bleed felt unlikely. Patient given appropriate return instructions in case symptoms/exam changes.     Renne Crigler, PA-C 11/17/13 2348
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
## 2013-11-17 NOTE — ED Notes (Signed)
Pt arrived from [facility] with no mask on; pt given mask; taken to room; droplet precautions sign placed on door; RN aware.

## 2013-11-17 NOTE — Discharge Instructions (Signed)
Please read and follow all provided instructions.  Your diagnoses today include:  1. Neck pain     Tests performed today include:  Vital signs - see below for your results today  Medications prescribed:   Vicodin (hydrocodone/acetaminophen) - narcotic pain medication  DO NOT drive or perform any activities that require you to be awake and alert because this medicine can make you drowsy. BE VERY CAREFUL not to take multiple medicines containing Tylenol (also called acetaminophen). Doing so can lead to an overdose which can damage your liver and cause liver failure and possibly death.   Robaxin (methocarbamol) - muscle relaxer medication  DO NOT drive or perform any activities that require you to be awake and alert because this medicine can make you drowsy.   Take any prescribed medications only as directed.  Home care instructions:   Follow any educational materials contained in this packet  Please rest, use ice or heat on your neck for the next several days  Do not lift, push, pull anything more than 10 pounds for the next week  Follow-up instructions: Please follow-up with your primary care provider in the next 3 days for further evaluation of your symptoms. If you do not have a primary care doctor -- see below for referral information.   Return instructions:  SEEK IMMEDIATE MEDICAL ATTENTION IF YOU HAVE:  New numbness, tingling, weakness, or problem with the use of your arms or legs  Severe back pain not relieved with medications  Loss control of your bowels or bladder  Increasing pain in any areas of the body (such as chest or abdominal pain)  Shortness of breath, dizziness, or fainting.   Worsening nausea (feeling sick to your stomach), vomiting, fever, or sweats  Any other emergent concerns regarding your health   Additional Information:  Your vital signs today were: BP 93/58   Pulse 63   Temp(Src) 98.3 F (36.8 C) (Oral)   Resp 15   Ht 4\' 11"  (1.499 m)    Wt 126 lb (57.153 kg)   BMI 25.44 kg/m2   SpO2 100%   LMP 10/29/2013 If your blood pressure (BP) was elevated above 135/85 this visit, please have this repeated by your doctor within one month. --------------

## 2014-04-24 ENCOUNTER — Other Ambulatory Visit (HOSPITAL_BASED_OUTPATIENT_CLINIC_OR_DEPARTMENT_OTHER): Payer: Self-pay | Admitting: Internal Medicine

## 2014-04-24 DIAGNOSIS — N949 Unspecified condition associated with female genital organs and menstrual cycle: Secondary | ICD-10-CM

## 2014-04-24 DIAGNOSIS — R102 Pelvic and perineal pain: Secondary | ICD-10-CM

## 2014-05-05 ENCOUNTER — Emergency Department (HOSPITAL_COMMUNITY)
Admission: EM | Admit: 2014-05-05 | Discharge: 2014-05-05 | Disposition: A | Discharge status: Home / Self Care | Payer: Medicaid Other | Source: Home / Self Care | Attending: Emergency Medicine | Admitting: Emergency Medicine

## 2014-05-05 ENCOUNTER — Encounter (HOSPITAL_COMMUNITY): Payer: Self-pay | Admitting: Emergency Medicine

## 2014-05-05 DIAGNOSIS — G44229 Chronic tension-type headache, not intractable: Secondary | ICD-10-CM

## 2014-05-05 DIAGNOSIS — M791 Myalgia, unspecified site: Secondary | ICD-10-CM

## 2014-05-05 DIAGNOSIS — IMO0001 Reserved for inherently not codable concepts without codable children: Secondary | ICD-10-CM

## 2014-05-05 MED ORDER — KETOROLAC TROMETHAMINE 60 MG/2ML IM SOLN
INTRAMUSCULAR | Status: AC
  Filled 2014-05-05: qty 2

## 2014-05-05 MED ORDER — METOCLOPRAMIDE HCL 10 MG PO TABS: ORAL_TABLET | ORAL | Status: AC

## 2014-05-05 MED ORDER — METHYLPREDNISOLONE ACETATE 80 MG/ML IJ SUSP
INTRAMUSCULAR | Status: AC
  Filled 2014-05-05: qty 1

## 2014-05-05 MED ORDER — METHYLPREDNISOLONE ACETATE 80 MG/ML IJ SUSP
80.0000 mg | Freq: Once | INTRAMUSCULAR | Status: AC
  Administered 2014-05-05: 80 mg via INTRAMUSCULAR

## 2014-05-05 MED ORDER — AMITRIPTYLINE HCL 25 MG PO TABS: 25.0000 mg | ORAL_TABLET | Freq: Every day | ORAL | Status: DC

## 2014-05-05 MED ORDER — METOCLOPRAMIDE HCL 5 MG/ML IJ SOLN
INTRAMUSCULAR | Status: AC
  Filled 2014-05-05: qty 2

## 2014-05-05 MED ORDER — KETOROLAC TROMETHAMINE 60 MG/2ML IM SOLN
60.0000 mg | Freq: Once | INTRAMUSCULAR | Status: AC
  Administered 2014-05-05: 60 mg via INTRAMUSCULAR

## 2014-05-05 MED ORDER — DICLOFENAC SODIUM 75 MG PO TBEC: DELAYED_RELEASE_TABLET | ORAL | Status: DC

## 2014-05-05 MED ORDER — METOCLOPRAMIDE HCL 5 MG/ML IJ SOLN
10.0000 mg | Freq: Once | INTRAMUSCULAR | Status: AC
  Administered 2014-05-05: 10 mg via INTRAMUSCULAR
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
## 2014-05-05 NOTE — Discharge Instructions (Signed)
Headaches, Frequently Asked Questions °MIGRAINE HEADACHES °Q: What is migraine? What causes it? How can I treat it? °A: Generally, migraine headaches begin as a dull ache. Then they develop into a constant, throbbing, and pulsating pain. You may experience pain at the temples. You may experience pain at the front or back of one or both sides of the head. The pain is usually accompanied by a combination of: °· Nausea. °· Vomiting. °· Sensitivity to light and noise. °Some people (about 15%) experience an aura (see below) before an attack. The cause of migraine is believed to be chemical reactions in the brain. Treatment for migraine may include over-the-counter or prescription medications. It may also include self-help techniques. These include relaxation training and biofeedback.  °Q: What is an aura? °A: About 15% of people with migraine get an "aura". This is a sign of neurological symptoms that occur before a migraine headache. You may see wavy or jagged lines, dots, or flashing lights. You might experience tunnel vision or blind spots in one or both eyes. The aura can include visual or auditory hallucinations (something imagined). It may include disruptions in smell (such as strange odors), taste or touch. Other symptoms include: °· Numbness. °· A "pins and needles" sensation. °· Difficulty in recalling or speaking the correct word. °These neurological events may last as long as 60 minutes. These symptoms will fade as the headache begins. °Q: What is a trigger? °A: Certain physical or environmental factors can lead to or "trigger" a migraine. These include: °· Foods. °· Hormonal changes. °· Weather. °· Stress. °It is important to remember that triggers are different for everyone. To help prevent migraine attacks, you need to figure out which triggers affect you. Keep a headache diary. This is a good way to track triggers. The diary will help you talk to your healthcare professional about your condition. °Q: Does  weather affect migraines? °A: Bright sunshine, hot, humid conditions, and drastic changes in barometric pressure may lead to, or "trigger," a migraine attack in some people. But studies have shown that weather does not act as a trigger for everyone with migraines. °Q: What is the link between migraine and hormones? °A: Hormones start and regulate many of your body's functions. Hormones keep your body in balance within a constantly changing environment. The levels of hormones in your body are unbalanced at times. Examples are during menstruation, pregnancy, or menopause. That can lead to a migraine attack. In fact, about three quarters of all women with migraine report that their attacks are related to the menstrual cycle.  °Q: Is there an increased risk of stroke for migraine sufferers? °A: The likelihood of a migraine attack causing a stroke is very remote. That is not to say that migraine sufferers cannot have a stroke associated with their migraines. In persons under age 40, the most common associated factor for stroke is migraine headache. But over the course of a person's normal life span, the occurrence of migraine headache may actually be associated with a reduced risk of dying from cerebrovascular disease due to stroke.  °Q: What are acute medications for migraine? °A: Acute medications are used to treat the pain of the headache after it has started. Examples over-the-counter medications, NSAIDs, ergots, and triptans.  °Q: What are the triptans? °A: Triptans are the newest class of abortive medications. They are specifically targeted to treat migraine. Triptans are vasoconstrictors. They moderate some chemical reactions in the brain. The triptans work on receptors in your brain. Triptans help   to restore the balance of a neurotransmitter called serotonin. Fluctuations in levels of serotonin are thought to be a main cause of migraine.  °Q: Are over-the-counter medications for migraine effective? °A:  Over-the-counter, or "OTC," medications may be effective in relieving mild to moderate pain and associated symptoms of migraine. But you should see your caregiver before beginning any treatment regimen for migraine.  °Q: What are preventive medications for migraine? °A: Preventive medications for migraine are sometimes referred to as "prophylactic" treatments. They are used to reduce the frequency, severity, and length of migraine attacks. Examples of preventive medications include antiepileptic medications, antidepressants, beta-blockers, calcium channel blockers, and NSAIDs (nonsteroidal anti-inflammatory drugs). °Q: Why are anticonvulsants used to treat migraine? °A: During the past few years, there has been an increased interest in antiepileptic drugs for the prevention of migraine. They are sometimes referred to as "anticonvulsants". Both epilepsy and migraine may be caused by similar reactions in the brain.  °Q: Why are antidepressants used to treat migraine? °A: Antidepressants are typically used to treat people with depression. They may reduce migraine frequency by regulating chemical levels, such as serotonin, in the brain.  °Q: What alternative therapies are used to treat migraine? °A: The term "alternative therapies" is often used to describe treatments considered outside the scope of conventional Western medicine. Examples of alternative therapy include acupuncture, acupressure, and yoga. Another common alternative treatment is herbal therapy. Some herbs are believed to relieve headache pain. Always discuss alternative therapies with your caregiver before proceeding. Some herbal products contain arsenic and other toxins. °TENSION HEADACHES °Q: What is a tension-type headache? What causes it? How can I treat it? °A: Tension-type headaches occur randomly. They are often the result of temporary stress, anxiety, fatigue, or anger. Symptoms include soreness in your temples, a tightening band-like sensation  around your head (a "vice-like" ache). Symptoms can also include a pulling feeling, pressure sensations, and contracting head and neck muscles. The headache begins in your forehead, temples, or the back of your head and neck. Treatment for tension-type headache may include over-the-counter or prescription medications. Treatment may also include self-help techniques such as relaxation training and biofeedback. °CLUSTER HEADACHES °Q: What is a cluster headache? What causes it? How can I treat it? °A: Cluster headache gets its name because the attacks come in groups. The pain arrives with little, if any, warning. It is usually on one side of the head. A tearing or bloodshot eye and a runny nose on the same side of the headache may also accompany the pain. Cluster headaches are believed to be caused by chemical reactions in the brain. They have been described as the most severe and intense of any headache type. Treatment for cluster headache includes prescription medication and oxygen. °SINUS HEADACHES °Q: What is a sinus headache? What causes it? How can I treat it? °A: When a cavity in the bones of the face and skull (a sinus) becomes inflamed, the inflammation will cause localized pain. This condition is usually the result of an allergic reaction, a tumor, or an infection. If your headache is caused by a sinus blockage, such as an infection, you will probably have a fever. An x-ray will confirm a sinus blockage. Your caregiver's treatment might include antibiotics for the infection, as well as antihistamines or decongestants.  °REBOUND HEADACHES °Q: What is a rebound headache? What causes it? How can I treat it? °A: A pattern of taking acute headache medications too often can lead to a condition known as "rebound headache."   A pattern of taking too much headache medication includes taking it more than 2 days per week or in excessive amounts. That means more than the label or a caregiver advises. With rebound  headaches, your medications not only stop relieving pain, they actually begin to cause headaches. Doctors treat rebound headache by tapering the medication that is being overused. Sometimes your caregiver will gradually substitute a different type of treatment or medication. Stopping may be a challenge. Regularly overusing a medication increases the potential for serious side effects. Consult a caregiver if you regularly use headache medications more than 2 days per week or more than the label advises. ADDITIONAL QUESTIONS AND ANSWERS Q: What is biofeedback? A: Biofeedback is a self-help treatment. Biofeedback uses special equipment to monitor your body's involuntary physical responses. Biofeedback monitors:  Breathing.  Pulse.  Heart rate.  Temperature.  Muscle tension.  Brain activity. Biofeedback helps you refine and perfect your relaxation exercises. You learn to control the physical responses that are related to stress. Once the technique has been mastered, you do not need the equipment any more. Q: Are headaches hereditary? A: Four out of five (80%) of people that suffer report a family history of migraine. Scientists are not sure if this is genetic or a family predisposition. Despite the uncertainty, a child has a 50% chance of having migraine if one parent suffers. The child has a 75% chance if both parents suffer.  Q: Can children get headaches? A: By the time they reach high school, most young people have experienced some type of headache. Many safe and effective approaches or medications can prevent a headache from occurring or stop it after it has begun.  Q: What type of doctor should I see to diagnose and treat my headache? A: Start with your primary caregiver. Discuss his or her experience and approach to headaches. Discuss methods of classification, diagnosis, and treatment. Your caregiver may decide to recommend you to a headache specialist, depending upon your symptoms or other  physical conditions. Having diabetes, allergies, etc., may require a more comprehensive and inclusive approach to your headache. The National Headache Foundation will provide, upon request, a list of [facility] physician members in your state. Document Released: 11/05/2003 Document Revised: 11/07/2011 Document Reviewed: 04/14/2008 [facility] Patient Information 2015 [place], [place]. This information is not intended to replace advice given to you by your health care provider. Make sure you discuss any questions you have with your health care provider.   Fibromyalgia Fibromyalgia is a disorder that is often misunderstood. It is associated with muscular pains and tenderness that comes and goes. It is often associated with fatigue and sleep disturbances. Though it tends to be long-lasting, fibromyalgia is not life-threatening. CAUSES  The exact cause of fibromyalgia is unknown. People with certain gene types are predisposed to developing fibromyalgia and other conditions. Certain factors can play a role as triggers, such as:  Spine disorders.  Arthritis.  Severe injury (trauma) and other physical stressors.  Emotional stressors. SYMPTOMS   The main symptom is pain and stiffness in the muscles and joints, which can vary over time.  Sleep and fatigue problems. Other related symptoms may include:  Bowel and bladder problems.  Headaches.  Visual problems.  Problems with odors and noises.  Depression or mood changes.  Painful periods (dysmenorrhea).  Dryness of the skin or eyes. DIAGNOSIS  There are no specific tests for diagnosing fibromyalgia. Patients can be diagnosed accurately from the specific symptoms they have. The diagnosis is made by determining  that nothing else is causing the problems. TREATMENT  There is no cure. Management includes medicines and an active, healthy lifestyle. The goal is to enhance physical fitness, decrease pain, and improve sleep. HOME CARE INSTRUCTIONS   Only  take over-the-counter or prescription medicines as directed by your caregiver. Sleeping pills, tranquilizers, and pain medicines may make your problems worse.  Low-impact aerobic exercise is very important and advised for treatment. At first, it may seem to make pain worse. Gradually increasing your tolerance will overcome this feeling.  Learning relaxation techniques and how to control stress will help you. Biofeedback, visual imagery, hypnosis, muscle relaxation, yoga, and meditation are all options.  Anti-inflammatory medicines and physical therapy may provide short-term help.  Acupuncture or massage treatments may help.  Take muscle relaxant medicines as suggested by your caregiver.  Avoid stressful situations.  Plan a healthy lifestyle. This includes your diet, sleep, rest, exercise, and friends.  Find and practice a hobby you enjoy.  Join a fibromyalgia support group for interaction, ideas, and sharing advice. This may be helpful. SEEK MEDICAL CARE IF:  You are not having good results or improvement from your treatment. FOR MORE INFORMATION  National Fibromyalgia Association: www.fmaware.org Arthritis Foundation: www.arthritis.org Document Released: 08/15/2005 Document Revised: 11/07/2011 Document Reviewed: 11/25/2009 [facility] Patient Information 2015 [place], [place]. This information is not intended to replace advice given to you by your health care provider. Make sure you discuss any questions you have with your health care provider.

## 2014-05-05 NOTE — ED Notes (Signed)
C/o headaches and body  aches since Friday.  Denies fever, n/v/d. No relief with excedrin migraine.

## 2014-05-06 LAB — POCT I-STAT, CHEM 8
BUN: 16 mg/dL (ref 6–23)
CREATININE: 0.7 mg/dL (ref 0.50–1.10)
Calcium, Ion: 1.22 mmol/L (ref 1.12–1.23)
Chloride: 107 mEq/L (ref 96–112)
Glucose, Bld: 86 mg/dL (ref 70–99)
HCT: 40 % (ref 36.0–46.0)
HEMOGLOBIN: 13.6 g/dL (ref 12.0–15.0)
POTASSIUM: 3.9 meq/L (ref 3.7–5.3)
Sodium: 138 mEq/L (ref 137–147)
TCO2: 20 mmol/L (ref 0–100)

## 2014-08-06 ENCOUNTER — Encounter (HOSPITAL_COMMUNITY): Payer: Self-pay | Admitting: *Deleted

## 2014-08-06 ENCOUNTER — Emergency Department (HOSPITAL_COMMUNITY): Payer: Medicaid Other

## 2014-08-06 ENCOUNTER — Emergency Department (HOSPITAL_COMMUNITY)
Admission: EM | Admit: 2014-08-06 | Discharge: 2014-08-06 | Disposition: A | Discharge status: Home / Self Care | Payer: Medicaid Other | Attending: Emergency Medicine | Admitting: Emergency Medicine

## 2014-08-06 DIAGNOSIS — Z79899 Other long term (current) drug therapy: Secondary | ICD-10-CM | POA: Diagnosis not present

## 2014-08-06 DIAGNOSIS — R55 Syncope and collapse: Secondary | ICD-10-CM | POA: Diagnosis not present

## 2014-08-06 DIAGNOSIS — D649 Anemia, unspecified: Secondary | ICD-10-CM | POA: Insufficient documentation

## 2014-08-06 DIAGNOSIS — K589 Irritable bowel syndrome without diarrhea: Secondary | ICD-10-CM | POA: Insufficient documentation

## 2014-08-06 DIAGNOSIS — R195 Other fecal abnormalities: Secondary | ICD-10-CM | POA: Diagnosis not present

## 2014-08-06 DIAGNOSIS — K921 Melena: Secondary | ICD-10-CM

## 2014-08-06 DIAGNOSIS — R531 Weakness: Secondary | ICD-10-CM | POA: Diagnosis present

## 2014-08-06 DIAGNOSIS — R1031 Right lower quadrant pain: Secondary | ICD-10-CM | POA: Diagnosis not present

## 2014-08-06 DIAGNOSIS — G8929 Other chronic pain: Secondary | ICD-10-CM | POA: Insufficient documentation

## 2014-08-06 HISTORY — DX: Reserved for concepts with insufficient information to code with codable children: IMO0002

## 2014-08-06 HISTORY — DX: Irritable bowel syndrome, unspecified: K58.9

## 2014-08-06 HISTORY — DX: Systemic lupus erythematosus, unspecified: M32.9

## 2014-08-06 LAB — URINALYSIS, ROUTINE W REFLEX MICROSCOPIC
Bilirubin Urine: NEGATIVE
Glucose, UA: NEGATIVE mg/dL
HGB URINE DIPSTICK: NEGATIVE
Ketones, ur: NEGATIVE mg/dL
Leukocytes, UA: NEGATIVE
NITRITE: POSITIVE — AB
PROTEIN: NEGATIVE mg/dL
Specific Gravity, Urine: 1.015 (ref 1.005–1.030)
Urobilinogen, UA: 0.2 mg/dL (ref 0.0–1.0)
pH: 6.5 (ref 5.0–8.0)

## 2014-08-06 LAB — URINE MICROSCOPIC-ADD ON

## 2014-08-06 LAB — CBC WITH DIFFERENTIAL/PLATELET
BASOS PCT: 1 % (ref 0–1)
Basophils Absolute: 0 10*3/uL (ref 0.0–0.1)
EOS ABS: 0.1 10*3/uL (ref 0.0–0.7)
Eosinophils Relative: 3 % (ref 0–5)
HEMATOCRIT: 27.9 % — AB (ref 36.0–46.0)
HEMOGLOBIN: 8.9 g/dL — AB (ref 12.0–15.0)
LYMPHS ABS: 2 10*3/uL (ref 0.7–4.0)
Lymphocytes Relative: 38 % (ref 12–46)
MCH: 22.8 pg — AB (ref 26.0–34.0)
MCHC: 31.9 g/dL (ref 30.0–36.0)
MCV: 71.5 fL — ABNORMAL LOW (ref 78.0–100.0)
MONOS PCT: 9 % (ref 3–12)
Monocytes Absolute: 0.5 10*3/uL (ref 0.1–1.0)
NEUTROS PCT: 49 % (ref 43–77)
Neutro Abs: 2.6 10*3/uL (ref 1.7–7.7)
Platelets: 277 10*3/uL (ref 150–400)
RBC: 3.9 MIL/uL (ref 3.87–5.11)
RDW: 16.2 % — ABNORMAL HIGH (ref 11.5–15.5)
WBC: 5.2 10*3/uL (ref 4.0–10.5)

## 2014-08-06 LAB — COMPREHENSIVE METABOLIC PANEL
ALK PHOS: 67 U/L (ref 39–117)
ALT: 10 U/L (ref 0–35)
ANION GAP: 10 (ref 5–15)
AST: 21 U/L (ref 0–37)
Albumin: 3.2 g/dL — ABNORMAL LOW (ref 3.5–5.2)
BUN: 10 mg/dL (ref 6–23)
CO2: 24 mEq/L (ref 19–32)
CREATININE: 0.6 mg/dL (ref 0.50–1.10)
Calcium: 8.4 mg/dL (ref 8.4–10.5)
Chloride: 104 mEq/L (ref 96–112)
GFR calc non Af Amer: >90 mL/min (ref >90)
GLUCOSE: 89 mg/dL (ref 70–99)
POTASSIUM: 3.6 meq/L — AB (ref 3.7–5.3)
Sodium: 138 mEq/L (ref 137–147)
TOTAL PROTEIN: 7.2 g/dL (ref 6.0–8.3)
Total Bilirubin: <0.2 mg/dL — ABNORMAL LOW (ref 0.3–1.2)

## 2014-08-06 LAB — I-STAT BETA HCG BLOOD, ED (MC, WL, AP ONLY): I-stat hCG, quantitative: <5 m[IU]/mL (ref <5)

## 2014-08-06 LAB — CBG MONITORING, ED: Glucose-Capillary: 76 mg/dL (ref 70–99)

## 2014-08-06 LAB — POC OCCULT BLOOD, ED: Fecal Occult Bld: POSITIVE — AB

## 2014-08-06 LAB — TROPONIN I

## 2014-08-06 MED ORDER — SODIUM CHLORIDE 0.9 % IV SOLN
1000.0000 mL | Freq: Once | INTRAVENOUS | Status: AC
  Administered 2014-08-06: 1000 mL via INTRAVENOUS

## 2014-08-06 MED ORDER — SODIUM CHLORIDE 0.9 % IV SOLN
1000.0000 mL | INTRAVENOUS | Status: DC
  Administered 2014-08-06: 1000 mL via INTRAVENOUS

## 2014-08-06 NOTE — ED Notes (Signed)
Pt ambulated in hallway to restroom, states she feels good, no assistance needed.

## 2014-08-06 NOTE — ED Notes (Signed)
UNABLE TO OBTAIN BLOOD rn MADE AWARE

## 2014-08-06 NOTE — Discharge Instructions (Signed)
1. Medications: vicodin for join pain, usual home medications 2. Treatment: rest, drink plenty of fluids,  3. Follow Up: Please followup with your primary doctor and gastroenterologist in 2-3 days for discussion of your diagnoses and further evaluation after today's visit; if you do not have a primary care doctor use the resource guide provided to find one; Please return to the ER for frank blood in stools, black tarry stools, further episodes of passing out, high fevers or other concerning symptoms    Anemia, Nonspecific Anemia is a condition in which the concentration of red blood cells or hemoglobin in the blood is below normal. Hemoglobin is a substance in red blood cells that carries oxygen to the tissues of the body. Anemia results in not enough oxygen reaching these tissues.  CAUSES  Common causes of anemia include:   Excessive bleeding. Bleeding may be internal or external. This includes excessive bleeding from periods (in women) or from the intestine.   Poor nutrition.   Chronic kidney, thyroid, and liver disease.  Bone marrow disorders that decrease red blood cell production.  Cancer and treatments for cancer.  HIV, AIDS, and their treatments.  Spleen problems that increase red blood cell destruction.  Blood disorders.  Excess destruction of red blood cells due to infection, medicines, and autoimmune disorders. SIGNS AND SYMPTOMS   Minor weakness.   Dizziness.   Headache.  Palpitations.   Shortness of breath, especially with exercise.   Paleness.  Cold sensitivity.  Indigestion.  Nausea.  Difficulty sleeping.  Difficulty concentrating. Symptoms may occur suddenly or they may develop slowly.  DIAGNOSIS  Additional blood tests are often needed. These help your health care provider determine the best treatment. Your health care provider will check your stool for blood and look for other causes of blood loss.  TREATMENT  Treatment varies depending  on the cause of the anemia. Treatment can include:   Supplements of iron, vitamin B12, or folic acid.   Hormone medicines.   A blood transfusion. This may be needed if blood loss is severe.   Hospitalization. This may be needed if there is significant continual blood loss.   Dietary changes.  Spleen removal. HOME CARE INSTRUCTIONS Keep all follow-up appointments. It often takes many weeks to correct anemia, and having your health care provider check on your condition and your response to treatment is very important. SEEK IMMEDIATE MEDICAL CARE IF:   You develop extreme weakness, shortness of breath, or chest pain.   You become dizzy or have trouble concentrating.  You develop heavy vaginal bleeding.   You develop a rash.   You have bloody or black, tarry stools.   You faint.   You vomit up blood.   You vomit repeatedly.   You have abdominal pain.  You have a fever or persistent symptoms for more than 2-3 days.   You have a fever and your symptoms suddenly get worse.   You are dehydrated.  MAKE SURE YOU:  Understand these instructions.  Will watch your condition.  Will get help right away if you are not doing well or get worse. Document Released: 09/22/2004 Document Revised: 04/17/2013 Document Reviewed: 02/08/2013 [facility] Patient Information 2015 [place], [place]. This information is not intended to replace advice given to you by your health care provider. Make sure you discuss any questions you have with your health care provider.   Bloody Stools Bloody stools often mean that there is a problem in the digestive tract. Your caregiver may use the term "  melena" to describe black, tarry, and bad smelling stools or "hematochezia" to describe red or maroon-colored stools. Blood seen in the stool can be caused by bleeding anywhere along the intestinal tract.  A black stool usually means that blood is coming from the upper part of the gastrointestinal tract  (esophagus, stomach, or small bowel). Passing maroon-colored stools or bright red blood usually means that blood is coming from lower down in the large bowel or the rectum. However, sometimes massive bleeding in the stomach or small intestine can cause bright red bloody stools.  Consuming black licorice, lead, iron pills, medicines containing bismuth subsalicylate, or blueberries can also cause black stools. Your caregiver can test black stools to see if blood is present. It is important that the cause of the bleeding be found. Treatment can then be started, and the problem can be corrected. Rectal bleeding may not be serious, but you should not assume everything is okay until you know the cause.It is very important to follow up with your caregiver or a specialist in gastrointestinal problems. CAUSES  Blood in the stools can come from various underlying causes.Often, the cause is not found during your first visit. Testing is often needed to discover the cause of bleeding in the gastrointestinal tract. Causes range from simple to serious or even life-threatening.Possible causes include:  Hemorrhoids.These are veins that are full of blood (engorged) in the rectum. They cause pain, inflammation, and may bleed.  Anal fissures.These are areas of painful tearing which may bleed. They are often caused by passing hard stool.  Diverticulosis.These are pouches that form on the colon over time, with age, and may bleed significantly.  Diverticulitis.This is inflammation in areas with diverticulosis. It can cause pain, fever, and bloody stools, although bleeding is rare.  Proctitis and colitis. These are inflamed areas of the rectum or colon. They may cause pain, fever, and bloody stools.  Polyps and cancer. Colon cancer is a leading cause of preventable cancer death.It often starts out as precancerous polyps that can be removed during a colonoscopy, preventing progression into cancer. Sometimes, polyps  and cancer may cause rectal bleeding.  Gastritis and ulcers.Bleeding from the upper gastrointestinal tract (near the stomach) may travel through the intestines and produce black, sometimes tarry, often bad smelling stools. In certain cases, if the bleeding is fast enough, the stools may not be black, but red and the condition may be life-threatening. SYMPTOMS  You may have stools that are bright red and bloody, that are normal color with blood on them, or that are dark black and tarry. In some cases, you may only have blood in the toilet bowl. Any of these cases need medical care. You may also have:  Pain at the anus or anywhere in the rectum.  Lightheadedness or feeling faint.  Extreme weakness.  Nausea or vomiting.  Fever. DIAGNOSIS Your caregiver may use the following methods to find the cause of your bleeding:  Taking a medical history. Age is important. Older people tend to develop polyps and cancer more often. If there is anal pain and a hard, large stool associated with bleeding, a tear of the anus may be the cause. If blood drips into the toilet after a bowel movement, bleeding hemorrhoids may be the problem. The color and frequency of the bleeding are additional considerations. In most cases, the medical history provides clues, but seldom the final answer.  A visual and finger (digital) exam. Your caregiver will inspect the anal area, looking for tears and hemorrhoids.  A finger exam can provide information when there is tenderness or a growth inside. In men, the prostate is also examined.  Endoscopy. Several types of small, long scopes (endoscopes) are used to view the colon.  In the office, your caregiver may use a rigid, or more commonly, a flexible viewing sigmoidoscope. This exam is called flexible sigmoidoscopy. It is performed in 5 to 10 minutes.  A more thorough exam is accomplished with a colonoscope. It allows your caregiver to view the entire 5 to 6 foot long colon.  Medicine to help you relax (sedative) is usually given for this exam. Frequently, a bleeding lesion may be present beyond the reach of the sigmoidoscope. So, a colonoscopy may be the best exam to start with. Both exams are usually done on an outpatient basis. This means the patient does not stay overnight in the hospital or surgery center.  An upper endoscopy may be needed to examine your stomach. Sedation is used and a flexible endoscope is put in your mouth, down to your stomach.  A barium enema X-ray. This is an X-ray exam. It uses liquid barium inserted by enema into the rectum. This test alone may not identify an actual bleeding point. X-rays highlight abnormal shadows, such as those made by lumps (tumors), diverticuli, or colitis. TREATMENT  Treatment depends on the cause of your bleeding.   For bleeding from the stomach or colon, the caregiver doing your endoscopy or colonoscopy may be able to stop the bleeding as part of the procedure.  Inflammation or infection of the colon can be treated with medicines.  Many rectal problems can be treated with creams, suppositories, or warm baths.  Surgery is sometimes needed.  Blood transfusions are sometimes needed if you have lost a lot of blood.  For any bleeding problem, let your caregiver know if you take aspirin or other blood thinners regularly. HOME CARE INSTRUCTIONS   Take any medicines exactly as prescribed.  Keep your stools soft by eating a diet high in fiber. Prunes (1 to 3 a day) work well for many people.  Drink enough water and fluids to keep your urine clear or pale yellow.  Take sitz baths if advised. A sitz bath is when you sit in a bathtub with warm water for 10 to 15 minutes to soak, soothe, and cleanse the rectal area.  If enemas or suppositories are advised, be sure you know how to use them. Tell your caregiver if you have problems with this.  Monitor your bowel movements to look for signs of improvement or  worsening. SEEK MEDICAL CARE IF:   You do not improve in the time expected.  Your condition worsens after initial improvement.  You develop any new symptoms. SEEK IMMEDIATE MEDICAL CARE IF:   You develop severe or prolonged rectal bleeding.  You vomit blood.  You feel weak or faint.  You have a fever. MAKE SURE YOU:  Understand these instructions.  Will watch your condition.  Will get help right away if you are not doing well or get worse. Document Released: 08/05/2002 Document Revised: 11/07/2011 Document Reviewed: 12/31/2010 [facility] Patient Information 2015 [place]. This information is not intended to replace advice given to you by your health care provider. Make sure you discuss any questions you have with your health care provider.

## 2014-08-06 NOTE — ED Notes (Signed)
[facility] for EMS

## 2014-08-06 NOTE — ED Notes (Addendum)
Pt states she was at work when she started feeling hot and sweaty like she was going to pass out, states she did not have LOC, states she now has a headache, also woke up this morning w/ body aches like she's having a lupus flare up. Pt a/o x 4 at this time.

## 2014-08-06 NOTE — ED Notes (Signed)
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

## 2014-08-06 NOTE — ED Provider Notes (Signed)
CSN: [id number]     Arrival date & time 08/06/14  1448 History   First MD Initiated Contact with Patient 08/06/14 1522     Chief Complaint  Patient presents with  . Weakness     (Consider location/radiation/quality/duration/timing/severity/associated sxs/prior Treatment) The history is provided by the patient, medical records and a friend. No language interpreter was used.     Rachel Hammond is a 43 y.o. female  with a hx of sickle cell trait, anemia, lupus, IBS presents to the Emergency Department complaining of near syncope onset 1 PM today.  Pt reports that she has been treated for lupus with Cymbalta in the past. She reports that her Cymbalta was increased from 30mg  BID to 60 mg BID on Friday (4 days ago).  She reports Saturday she had an episode of shaking, feeling achy and lightheadedness.  She reports she slept for awhile and felt better upon waking.  She reports at 1pm today she began hot and sweaty like she was going to pass out and became shaky again.  Co-workers at the bedside report that patient had several syncopal episodes while lying on the floor described as pt "not talking" but deny any shaking or tremors during this time.  She did not fall or hit her head.  They report that she was 100% normal prior to the onset of this episode at 1 PM.  Associated symptoms include aching of her joints, especially her knees and elbows. She also reports a generalized, throbbing headache. She reports intermittent diplopia which has been ongoing for several weeks.  Patient also reports right-sided and right lower quadrant abdominal pain just been evaluated in the past and she's been diagnosed with IBS. She reports no change in her abdominal pain today. She denies bloody or black stools.  She reports Hx of anemia attributed to heavy menses and her last menses in late Nov was indeed heavy.  She does not know her baseline Hgb, but reports that her endocrinologist called yesterday to inform her that she was  severely anemia, but did not want her to start iron until her follow-up on 08/16/14.  No aggravating or alleviating factors. Patient denies fever, chills, neck pain, neck stiffness, chest pain, shortness of breath, nausea, vomiting, diarrhea, dysuria. Patient reports history of bilateral tubal ligation.  Past Medical History  Diagnosis Date  . Sickle cell trait   . Anemia   . IBS (irritable bowel syndrome)   . Lupus    Past Surgical History  Procedure Laterality Date  . Cesarean section  1993 and 1998    x 2  . Cholecystectomy  1991   Family History  Problem Relation Age of Onset  . Diabetes Mother     gangrene  . Cancer Father     brain   History  Substance Use Topics  . Smoking status: Never Smoker   . Smokeless tobacco: Not on file  . Alcohol Use: No   OB History    No data available     Review of Systems  Constitutional: Negative for fever, diaphoresis, appetite change, fatigue and unexpected weight change.  HENT: Negative for mouth sores.   Eyes: Positive for visual disturbance (diplopia for weeks).  Respiratory: Negative for cough, chest tightness, shortness of breath and wheezing.   Cardiovascular: Negative for chest pain.  Gastrointestinal: Positive for abdominal pain (chronic). Negative for nausea, vomiting, diarrhea and constipation.  Endocrine: Negative for polydipsia, polyphagia and polyuria.  Genitourinary: Negative for dysuria, urgency, frequency and hematuria.  Musculoskeletal:  Negative for back pain and neck stiffness.  Skin: Negative for rash.  Allergic/Immunologic: Negative for immunocompromised state.  Neurological: Positive for tremors and syncope (near). Negative for light-headedness and headaches.  Hematological: Does not bruise/bleed easily.  Psychiatric/Behavioral: Negative for sleep disturbance. The patient is not nervous/anxious.       Allergies  Codeine  Home Medications   Prior to Admission medications   Medication Sig Start Date  End Date Taking? Authorizing Provider  DULoxetine (CYMBALTA) 60 MG capsule Take 60 mg by mouth 2 (two) times daily.   Yes Historical Provider, MD  ergocalciferol (VITAMIN D2) 50000 UNITS capsule Take 50,000 Units by mouth once a week. Wednesday   Yes Historical Provider, MD  HYDROcodone-acetaminophen (NORCO/VICODIN) 5-325 MG per tablet Take 1-2 tablets every 6 hours as needed for severe pain Patient taking differently: Take 1 tablet by mouth 3 (three) times daily.  11/17/13  Yes Renne Crigler, PA-C  PRESCRIPTION MEDICATION Take 1 capsule by mouth 2 (two) times a week. For IBS   Yes Historical Provider, MD  topiramate (TOPAMAX) 100 MG tablet Take 100 mg by mouth 2 (two) times daily.   Yes Historical Provider, MD  amitriptyline (ELAVIL) 25 MG tablet Take 1 tablet (25 mg total) by mouth at bedtime. Patient not taking: Reported on 08/06/2014 05/05/14   Reuben Likes, MD  diclofenac (VOLTAREN) 75 MG EC tablet 1 every 12 hours as needed for headache Patient not taking: Reported on 08/06/2014 05/05/14   Reuben Likes, MD  methocarbamol (ROBAXIN) 500 MG tablet Take 2 tablets (1,000 mg total) by mouth 4 (four) times daily. Patient not taking: Reported on 08/06/2014 11/17/13   Renne Crigler, PA-C  metoCLOPramide (REGLAN) 10 MG tablet 1 every 12 hours as needed for headache Patient not taking: Reported on 08/06/2014 05/05/14   Reuben Likes, MD   BP 113/66 mmHg  Pulse 72  Temp(Src) 98.2 F (36.8 C) (Oral)  Resp 16  Ht 4\' 9"  (1.448 m)  Wt 126 lb (57.153 kg)  BMI 27.26 kg/m2  SpO2 100%  LMP 07/19/2014 (Approximate) Physical Exam  Constitutional: She is oriented to person, place, and time. She appears well-developed and well-nourished. No distress.  Awake, alert, nontoxic appearance  HENT:  Head: Normocephalic and atraumatic.  Mouth/Throat: Oropharynx is clear and moist. No oropharyngeal exudate.  Eyes: Conjunctivae and EOM are normal. Pupils are equal, round, and reactive to light. No scleral icterus.  No  horizontal, vertical or rotational nystagmus  Neck: Normal range of motion. Neck supple.  Full active and passive ROM without pain No midline or paraspinal tenderness No nuchal rigidity or meningeal signs  Cardiovascular: Normal rate, regular rhythm, normal heart sounds and intact distal pulses.   No murmur heard. Pulmonary/Chest: Effort normal and breath sounds normal. No respiratory distress. She has no wheezes. She has no rales.  Equal chest expansion  Abdominal: Soft. Normal appearance and bowel sounds are normal. She exhibits no mass. There is tenderness (R sided and RLQ) in the right lower quadrant. There is no rebound, no guarding and no CVA tenderness.  Mild right lower crying and abdominal pain without guarding, rebound or peritoneal signs. Patient reports this pain is baseline for her and not unusual. No CVA tenderness  Musculoskeletal: Normal range of motion. She exhibits no edema.  Lymphadenopathy:    She has no cervical adenopathy.  Neurological: She is alert and oriented to person, place, and time. She has normal reflexes. No cranial nerve deficit. She exhibits normal muscle tone. Coordination normal.  Mental Status:  Alert, oriented, thought content appropriate. Speech fluent without evidence of aphasia. Able to follow 2 step commands without difficulty.  Cranial Nerves:  II:  Peripheral visual fields grossly normal, pupils equal, round, reactive to light III,IV, VI: ptosis not present, extra-ocular motions intact bilaterally  V,VII: smile symmetric, facial light touch sensation equal VIII: hearing grossly normal bilaterally  IX,X: gag reflex present  XI: bilateral shoulder shrug equal and strong XII: midline tongue extension  Motor:  5/5 in upper and lower extremities bilaterally including strong and equal grip strength and dorsiflexion/plantar flexion Sensory: Pinprick and light touch normal in all extremities.  Deep Tendon Reflexes: 2+ and symmetric  Cerebellar: normal  finger-to-nose with bilateral upper extremities Gait: Gait testing deferred CV: distal pulses palpable throughout   Skin: Skin is warm and dry. No rash noted. She is not diaphoretic.  Psychiatric: She has a normal mood and affect. Her behavior is normal. Judgment and thought content normal.  Pt crying  Nursing note and vitals reviewed.   ED Course  Procedures (including critical care time) Labs Review Labs Reviewed  CBC WITH DIFFERENTIAL - Abnormal; Notable for the following:    Hemoglobin 8.9 (*)    HCT 27.9 (*)    MCV 71.5 (*)    MCH 22.8 (*)    RDW 16.2 (*)    All other components within normal limits  COMPREHENSIVE METABOLIC PANEL - Abnormal; Notable for the following:    Potassium 3.6 (*)    Albumin 3.2 (*)    Total Bilirubin <0.2 (*)    All other components within normal limits  URINALYSIS, ROUTINE W REFLEX MICROSCOPIC - Abnormal; Notable for the following:    APPearance CLOUDY (*)    Nitrite POSITIVE (*)    All other components within normal limits  URINE MICROSCOPIC-ADD ON - Abnormal; Notable for the following:    Squamous Epithelial / LPF MANY (*)    Bacteria, UA MANY (*)    All other components within normal limits  POC OCCULT BLOOD, ED - Abnormal; Notable for the following:    Fecal Occult Bld POSITIVE (*)    All other components within normal limits  TROPONIN I  POCT CBG (FASTING - GLUCOSE)-MANUAL ENTRY  I-STAT BETA HCG BLOOD, ED ([facility], [facility], [facility] ONLY)  CBG MONITORING, ED    Imaging Review Dg Chest 2 View  08/06/2014   CLINICAL DATA:  Initial encounter for one-day history of near syncope.  EXAM: CHEST  2 VIEW  COMPARISON:  None.  FINDINGS: The lungs are clear without focal infiltrate, edema, pneumothorax or pleural effusion. Nodular densities overlying each lower lung compatible with nipple shadows, as confirmed on lateral projection. The cardiopericardial silhouette is within normal limits for size. Imaged bony structures of the thorax are intact. Telemetry leads  overlie the chest.  IMPRESSION: No active cardiopulmonary disease.   Electronically Signed   By: Kennith Center M.D.   On: 08/06/2014 17:16     EKG Interpretation None      MDM   Final diagnoses:  Weakness  Near syncope  Anemia, unspecified anemia type  Blood in stool    Rachel Ivan presents with near syncope today while at work.  Pt is sleeping when I enter the room, but is easily arousable. Co-workers report syncope, but pt denies full syncope.  Pt with recent increase in Cymbalta that she thinks these episodes are from.  Neurologically intact and able to ambulate. Will begin syncope workup.   5:05PM - pt without orthostatic  vital signs Orthostatic Lying  - BP- Lying: 114/88 mmHg ; Pulse- Lying: 80  Orthostatic Sitting - BP- Sitting: 107/75 mmHg ; Pulse- Sitting: 72  Orthostatic Standing at 0 minutes - BP- Standing at 0 minutes: 104/73 mmHg ; Pulse- Standing at 0 minutes: 81   6:10PM Labs reassuring. Patient with anemia at 8.9 decreased from 11.5 approximately one year ago.  Patient denies melena or bloody stools however will check fecal occult.  Patient does endorse heavy periods which has caused her anemia in the past.  No evidence of infection, EKG nonischemic. Chest x-ray without pneumonia. Urinalysis pending.  7:15PM Urinalysis without evidence of urinary tract infection. Fecal occult positive however there is no gross blood on DRE. Patient's abdomen remained soft and only mildly tender in the right lower quadrant. She continues to be without rebound or peritoneal signs. Patient is afebrile and not tachycardic. She ambulates here in the emergency department without assistance, without dizziness or return of earlier feelings. She reports she feels well and would like to be discharged home. [place] Dr. Katrinka BlazingSmith.  She reports she can follow-up with him this week. [place] and she  reports she will call this physician in the morning.  I have personally reviewed patient's vitals, nursing note and any pertinent labs or imaging.  I performed an undressed physical exam.    It has been determined that no acute conditions requiring further emergency intervention are present at this time. The patient/guardian have been advised of the diagnosis and plan. I reviewed all labs and imaging including any potential incidental findings. We have discussed signs and symptoms that warrant return to the ED and they are listed in the discharge instructions.    Vital signs are stable at discharge.   BP 113/66 mmHg  Pulse 72  Temp(Src) 98.2 F (36.8 C) (Oral)  Resp 16  Ht 4\' 9"  (1.448 m)  Wt 126 lb (57.153 kg)  BMI 27.26 kg/m2  SpO2 100%  LMP 07/19/2014 (Approximate)    The patient was discussed with Dr. Micheline Mazeocherty who agrees with the treatment plan.     Dahlia ClientHannah Aryannah Mohon, PA-C 08/06/14 2128  Toy CookeyMegan Docherty, MD 08/07/14 1501

## 2014-10-27 ENCOUNTER — Other Ambulatory Visit: Payer: Self-pay | Admitting: Physician Assistant

## 2014-10-27 DIAGNOSIS — R109 Unspecified abdominal pain: Secondary | ICD-10-CM

## 2014-10-28 ENCOUNTER — Other Ambulatory Visit: Payer: Medicaid Other

## 2014-10-29 ENCOUNTER — Other Ambulatory Visit: Payer: Medicaid Other

## 2014-10-29 ENCOUNTER — Ambulatory Visit
Admission: RE | Admit: 2014-10-29 | Discharge: 2014-10-29 | Disposition: A | Discharge status: Home / Self Care | Payer: Medicaid Other | Source: Ambulatory Visit | Attending: Physician Assistant | Admitting: Physician Assistant

## 2014-10-29 DIAGNOSIS — R109 Unspecified abdominal pain: Secondary | ICD-10-CM

## 2014-11-23 ENCOUNTER — Emergency Department (INDEPENDENT_AMBULATORY_CARE_PROVIDER_SITE_OTHER)
Admission: EM | Admit: 2014-11-23 | Discharge: 2014-11-23 | Disposition: A | Discharge status: Home / Self Care | Payer: Medicaid Other | Source: Home / Self Care | Attending: Family Medicine | Admitting: Family Medicine

## 2014-11-23 ENCOUNTER — Encounter (HOSPITAL_COMMUNITY): Payer: Self-pay

## 2014-11-23 DIAGNOSIS — R69 Illness, unspecified: Principal | ICD-10-CM

## 2014-11-23 DIAGNOSIS — J111 Influenza due to unidentified influenza virus with other respiratory manifestations: Secondary | ICD-10-CM

## 2014-11-23 HISTORY — DX: Migraine, unspecified, not intractable, without status migrainosus: G43.909

## 2014-11-23 MED ORDER — ONDANSETRON 4 MG PO TBDP
ORAL_TABLET | ORAL | Status: AC
  Filled 2014-11-23: qty 1

## 2014-11-23 MED ORDER — AMITRIPTYLINE HCL 50 MG PO TABS: 50.0000 mg | ORAL_TABLET | Freq: Every day | ORAL | Status: DC

## 2014-11-23 MED ORDER — ONDANSETRON 4 MG PO TBDP
4.0000 mg | ORAL_TABLET | Freq: Once | ORAL | Status: AC
  Administered 2014-11-23: 4 mg via ORAL

## 2014-11-23 MED ORDER — KETOROLAC TROMETHAMINE 30 MG/ML IJ SOLN
30.0000 mg | Freq: Once | INTRAMUSCULAR | Status: AC
  Administered 2014-11-23: 30 mg via INTRAMUSCULAR

## 2014-11-23 MED ORDER — KETOROLAC TROMETHAMINE 30 MG/ML IJ SOLN
INTRAMUSCULAR | Status: AC
  Filled 2014-11-23: qty 1

## 2014-11-23 NOTE — Discharge Instructions (Signed)
Use medicine as prescribed and see your doctor if further problems °

## 2014-11-23 NOTE — ED Notes (Signed)
History of MHA, lupus. C/o she has had a HA since Friday night w nausea/vomiting. C/o feels as if the HA is centered around the top of her scalp where she has a bald/tender spot, and feels as if the wind is going through her head from one side to the other

## 2014-11-23 NOTE — ED Provider Notes (Signed)
CSN: [id number]     Arrival date & time 11/23/14  1507 History   First MD Initiated Contact with Patient 11/23/14 1521     Chief Complaint  Patient presents with  . Headache  . Emesis   (Consider location/radiation/quality/duration/timing/severity/associated sxs/prior Treatment) Patient is a 44 y.o. female presenting with headaches. The history is provided by the patient.  Headache Pain location:  Occipital Quality:  Sharp Radiates to:  Does not radiate Onset quality:  Gradual Progression:  Unchanged Chronicity:  New Similar to prior headaches: no (not like migraines, has tender area of alopecia on scalp , no erythema, no infection.)   Context: emotional stress   Associated symptoms: no blurred vision, no congestion, no cough, no dizziness, no drainage, no fever, no nausea, no neck stiffness and no photophobia     Past Medical History  Diagnosis Date  . Sickle cell trait   . Anemia   . IBS (irritable bowel syndrome)   . Lupus   . Migraine    Past Surgical History  Procedure Laterality Date  . Cesarean section  1993 and 1998    x 2  . Cholecystectomy  1991   Family History  Problem Relation Age of Onset  . Diabetes Mother     gangrene  . Cancer Father     brain   History  Substance Use Topics  . Smoking status: Never Smoker   . Smokeless tobacco: Not on file  . Alcohol Use: No   OB History    No data available     Review of Systems  Constitutional: Negative.  Negative for fever.  HENT: Negative for congestion, postnasal drip and rhinorrhea.   Eyes: Negative for blurred vision and photophobia.  Respiratory: Negative.  Negative for cough.   Cardiovascular: Negative.   Gastrointestinal: Negative.  Negative for nausea.  Genitourinary: Negative.   Musculoskeletal: Negative for neck stiffness.  Neurological: Positive for headaches. Negative for dizziness.    Allergies  Codeine  Home Medications   Prior to Admission medications   Medication Sig Start  Date End Date Taking? Authorizing Provider  DULoxetine (CYMBALTA) 60 MG capsule Take 60 mg by mouth 2 (two) times daily.   Yes Historical Provider, MD  ergocalciferol (VITAMIN D2) 50000 UNITS capsule Take 50,000 Units by mouth once a week. Wednesday   Yes Historical Provider, MD  topiramate (TOPAMAX) 100 MG tablet Take 100 mg by mouth 2 (two) times daily.   Yes Historical Provider, MD  amitriptyline (ELAVIL) 50 MG tablet Take 1 tablet (50 mg total) by mouth at bedtime. 11/23/14   Linna HoffJames D Kindl, MD  diclofenac (VOLTAREN) 75 MG EC tablet 1 every 12 hours as needed for headache Patient not taking: Reported on 08/06/2014 05/05/14   Reuben Likesavid C Keller, MD  HYDROcodone-acetaminophen (NORCO/VICODIN) 5-325 MG per tablet Take 1-2 tablets every 6 hours as needed for severe pain Patient taking differently: Take 1 tablet by mouth 3 (three) times daily.  11/17/13   Renne CriglerJoshua Geiple, PA-C  methocarbamol (ROBAXIN) 500 MG tablet Take 2 tablets (1,000 mg total) by mouth 4 (four) times daily. Patient not taking: Reported on 08/06/2014 11/17/13   Renne CriglerJoshua Geiple, PA-C  metoCLOPramide (REGLAN) 10 MG tablet 1 every 12 hours as needed for headache Patient not taking: Reported on 08/06/2014 05/05/14   Reuben Likesavid C Keller, MD  PRESCRIPTION MEDICATION Take 1 capsule by mouth 2 (two) times a week. For IBS    Historical Provider, MD   BP 116/81 mmHg  Pulse 75  Temp(Src) 99  F (37.2 C) (Oral)  Resp 14  SpO2 100% Physical Exam  Constitutional: She is oriented to person, place, and time. She appears well-developed and well-nourished.  HENT:  Head: Normocephalic.  Right Ear: External ear normal.  Left Ear: External ear normal.  Mouth/Throat: Oropharynx is clear and moist.  Neck: Normal range of motion. Neck supple.  Cardiovascular: Normal heart sounds.   Pulmonary/Chest: Effort normal and breath sounds normal.  Abdominal: Soft. Bowel sounds are normal.  Neurological: She is alert and oriented to person, place, and time.  Skin: Skin is  warm and dry.  Benign appearing scalp alopecia.  Nursing note and vitals reviewed.   ED Course  Procedures (including critical care time) Labs Review Labs Reviewed - No data to display  Imaging Review No results found.   MDM   1. Influenza-like illness        Linna Hoff, MD 11/23/14 [phone]

## 2015-08-27 ENCOUNTER — Emergency Department (HOSPITAL_COMMUNITY): Payer: Medicaid Other

## 2015-08-27 ENCOUNTER — Emergency Department (HOSPITAL_COMMUNITY)
Admission: EM | Admit: 2015-08-27 | Discharge: 2015-08-27 | Disposition: A | Discharge status: Home / Self Care | Payer: Medicaid Other | Attending: Emergency Medicine | Admitting: Emergency Medicine

## 2015-08-27 ENCOUNTER — Encounter (HOSPITAL_COMMUNITY): Payer: Self-pay | Admitting: Emergency Medicine

## 2015-08-27 DIAGNOSIS — Z862 Personal history of diseases of the blood and blood-forming organs and certain disorders involving the immune mechanism: Secondary | ICD-10-CM | POA: Insufficient documentation

## 2015-08-27 DIAGNOSIS — Z8739 Personal history of other diseases of the musculoskeletal system and connective tissue: Secondary | ICD-10-CM | POA: Diagnosis not present

## 2015-08-27 DIAGNOSIS — R519 Headache, unspecified: Secondary | ICD-10-CM

## 2015-08-27 DIAGNOSIS — R404 Transient alteration of awareness: Secondary | ICD-10-CM | POA: Diagnosis not present

## 2015-08-27 DIAGNOSIS — K589 Irritable bowel syndrome without diarrhea: Secondary | ICD-10-CM | POA: Diagnosis not present

## 2015-08-27 DIAGNOSIS — R51 Headache: Secondary | ICD-10-CM | POA: Diagnosis present

## 2015-08-27 DIAGNOSIS — Z79899 Other long term (current) drug therapy: Secondary | ICD-10-CM | POA: Diagnosis not present

## 2015-08-27 DIAGNOSIS — Z791 Long term (current) use of non-steroidal anti-inflammatories (NSAID): Secondary | ICD-10-CM | POA: Diagnosis not present

## 2015-08-27 LAB — CBC
HEMATOCRIT: 34.8 % — AB (ref 36.0–46.0)
Hemoglobin: 10.9 g/dL — ABNORMAL LOW (ref 12.0–15.0)
MCH: 21.7 pg — AB (ref 26.0–34.0)
MCHC: 31.3 g/dL (ref 30.0–36.0)
MCV: 69.3 fL — ABNORMAL LOW (ref 78.0–100.0)
Platelets: 324 10*3/uL (ref 150–400)
RBC: 5.02 MIL/uL (ref 3.87–5.11)
RDW: 18.4 % — ABNORMAL HIGH (ref 11.5–15.5)
WBC: 4.9 10*3/uL (ref 4.0–10.5)

## 2015-08-27 LAB — BASIC METABOLIC PANEL
ANION GAP: 10 (ref 5–15)
BUN: 10 mg/dL (ref 6–20)
CO2: 26 mmol/L (ref 22–32)
Calcium: 9 mg/dL (ref 8.9–10.3)
Chloride: 104 mmol/L (ref 101–111)
Creatinine, Ser: 0.53 mg/dL (ref 0.44–1.00)
GFR calc non Af Amer: >60 mL/min (ref >60)
GLUCOSE: 56 mg/dL — AB (ref 65–99)
POTASSIUM: 3.4 mmol/L — AB (ref 3.5–5.1)
Sodium: 140 mmol/L (ref 135–145)

## 2015-08-27 LAB — I-STAT TROPONIN, ED: Troponin i, poc: 0 ng/mL (ref 0.00–0.08)

## 2015-08-27 MED ORDER — DIPHENHYDRAMINE HCL 25 MG PO CAPS
25.0000 mg | ORAL_CAPSULE | Freq: Once | ORAL | Status: AC
  Administered 2015-08-27: 25 mg via ORAL
  Filled 2015-08-27: qty 1

## 2015-08-27 MED ORDER — METOCLOPRAMIDE HCL 10 MG PO TABS
5.0000 mg | ORAL_TABLET | Freq: Once | ORAL | Status: AC
  Administered 2015-08-27: 5 mg via ORAL
  Filled 2015-08-27: qty 1

## 2015-08-27 MED ORDER — KETOROLAC TROMETHAMINE 30 MG/ML IJ SOLN
30.0000 mg | Freq: Once | INTRAMUSCULAR | Status: AC
  Administered 2015-08-27: 30 mg via INTRAMUSCULAR
  Filled 2015-08-27: qty 1

## 2015-08-27 NOTE — ED Notes (Addendum)
Per EMS pt c/o exacerbation of severe lupus pain concentrated in hands. Per EMS, pt anxious, tearful, and not conversing well on scene. Pt's coworkers called EMS s/t pt shaking, not talking much, crying. Pt states the shaking and communication issues s/t Cymbalta, hx of the same. On assessment, pt adds chest pain, headache, and itching.

## 2015-08-27 NOTE — ED Notes (Signed)
Pt was at work and EMS called out by co-workers (she was not acting right). Pt c/o of generalized pain. (lupus flare up). Complaint with medications.

## 2015-08-27 NOTE — Discharge Instructions (Signed)
Please read attached information. If you experience any new or worsening signs or symptoms please return to the emergency room for evaluation. Please follow-up with your primary care provider or specialist as discussed. Please use medication prescribed only as directed and discontinue taking if you have any concerning signs or symptoms.   °

## 2015-08-27 NOTE — ED Provider Notes (Signed)
CSN: [id number]     Arrival date & time 08/27/15  1037 History   First MD Initiated Contact with Patient 08/27/15 1404     Chief Complaint  Patient presents with  . Hand Pain  . Chest Pain   HPI   44 year old female presents today with numerous complaints. Patient reports over the last several days she's had chest discomfort, body aches that have him and gone. She reports that the symptoms are similar to lupus flares. Patient was "shaky" this morning as she drove to work, but reports while she was at work she had transient episode of altered mental status where she had difficulty recalling all events. Staff at the facility noted that she was not acting appropriately, and appeared to be shaking as if she was having a seizure. Patient remembers EMS arriving and bring her to the ambulance and subsequently the emergency room. Patient reports a distant history of seizure, but has not had any follow-up evaluation. Patient reports she's had similar episodes previously and related to changes in her Cymbalta medication. At the time of evaluation patient reports that she is starting to develop a headache in the right side of her head, described as throbbing, similar to previous migraines. She reports associated light sensitivity, denies any nausea or vomiting focal neurological deficits. Patient reports that she is near her baseline mental status and is feeling "much better". She reports that the chest discomfort that she had over the last several days is gone at the time of my evaluation. Denies any drug or alcohol use, recent trauma, or decreased by mouth intake. No exposure to noxious sources.   Past Medical History  Diagnosis Date  . Sickle cell trait (HCC)   . Anemia   . IBS (irritable bowel syndrome)   . Lupus (HCC)   . Migraine    Past Surgical History  Procedure Laterality Date  . Cesarean section  1993 and 1998    x 2  . Cholecystectomy  1991   Family History  Problem Relation Age of Onset   . Diabetes Mother     gangrene  . Cancer Father     brain   Social History  Substance Use Topics  . Smoking status: Never Smoker   . Smokeless tobacco: None  . Alcohol Use: No   OB History    No data available     Review of Systems  All other systems reviewed and are negative.   Allergies  Codeine  Home Medications   Prior to Admission medications   Medication Sig Start Date End Date Taking? Authorizing Provider  ergocalciferol (VITAMIN D2) 50000 UNITS capsule Take 50,000 Units by mouth once a week. Wednesday   Yes Historical Provider, MD  ibuprofen (ADVIL,MOTRIN) 800 MG tablet Take 800 mg by mouth 3 (three) times daily.   Yes Historical Provider, MD  Linaclotide (LINZESS) 290 MCG CAPS capsule Take 290 mcg by mouth daily.   Yes Historical Provider, MD  nabumetone (RELAFEN) 500 MG tablet Take 500 mg by mouth 2 (two) times daily.   Yes Historical Provider, MD  phentermine (ADIPEX-P) 37.5 MG tablet Take 37.5 mg by mouth daily.   Yes Historical Provider, MD  pregabalin (LYRICA) 75 MG capsule Take 75 mg by mouth 2 (two) times daily.   Yes Historical Provider, MD  topiramate (TOPAMAX) 100 MG tablet Take 100 mg by mouth 2 (two) times daily.   Yes Historical Provider, MD  amitriptyline (ELAVIL) 50 MG tablet Take 1 tablet (50 mg total) by  mouth at bedtime. 11/23/14   Linna HoffJames D Kindl, MD  diclofenac (VOLTAREN) 75 MG EC tablet 1 every 12 hours as needed for headache Patient not taking: Reported on 08/06/2014 05/05/14   Reuben Likesavid C Keller, MD  DULoxetine (CYMBALTA) 60 MG capsule Take 60 mg by mouth 2 (two) times daily.    Historical Provider, MD  HYDROcodone-acetaminophen (NORCO/VICODIN) 5-325 MG per tablet Take 1-2 tablets every 6 hours as needed for severe pain Patient taking differently: Take 1 tablet by mouth 3 (three) times daily.  11/17/13   Renne CriglerJoshua Geiple, PA-C  methocarbamol (ROBAXIN) 500 MG tablet Take 2 tablets (1,000 mg total) by mouth 4 (four) times daily. Patient not taking: Reported  on 08/06/2014 11/17/13   Renne CriglerJoshua Geiple, PA-C  metoCLOPramide (REGLAN) 10 MG tablet 1 every 12 hours as needed for headache Patient not taking: Reported on 08/06/2014 05/05/14   Reuben Likesavid C Keller, MD  PRESCRIPTION MEDICATION Take 1 capsule by mouth 2 (two) times a week. For IBS    Historical Provider, MD   BP 114/76 mmHg  Pulse 66  Temp(Src) 98.3 F (36.8 C) (Oral)  Resp 14  Ht 4\' 11"  (1.499 m)  Wt 59.875 kg  BMI 26.65 kg/m2  SpO2 100%  LMP 07/19/2014 (Approximate)   Physical Exam  Constitutional: She is oriented to person, place, and time. She appears well-developed and well-nourished.  HENT:  Head: Normocephalic and atraumatic.  Eyes: Conjunctivae are normal. Pupils are equal, round, and reactive to light. Right eye exhibits no discharge. Left eye exhibits no discharge. No scleral icterus.  Neck: Normal range of motion. No JVD present. No tracheal deviation present.  Pulmonary/Chest: Effort normal. No stridor.  Neurological: She is alert and oriented to person, place, and time. She has normal strength. No cranial nerve deficit. Coordination normal. GCS eye subscore is 4. GCS verbal subscore is 5. GCS motor subscore is 6.  Reflex Scores:      Patellar reflexes are 2+ on the right side and 2+ on the left side. Psychiatric: She has a normal mood and affect. Her behavior is normal. Judgment and thought content normal.  Nursing note and vitals reviewed.   ED Course  Procedures (including critical care time) Labs Review Labs Reviewed  BASIC METABOLIC PANEL - Abnormal; Notable for the following:    Potassium 3.4 (*)    Glucose, Bld 56 (*)    All other components within normal limits  CBC - Abnormal; Notable for the following:    Hemoglobin 10.9 (*)    HCT 34.8 (*)    MCV 69.3 (*)    MCH 21.7 (*)    RDW 18.4 (*)    All other components within normal limits  Rosezena SensorI-STAT TROPOININ, ED    Imaging Review Dg Chest 2 View  08/27/2015  CLINICAL DATA:  44 year old female with history of lupus  presenting with chest pain. Additional history of pain in the hands bilaterally. EXAM: CHEST  2 VIEW COMPARISON:  Chest x-ray 08/06/2014. FINDINGS: Lung volumes are normal. No consolidative airspace disease. No pleural effusions. No pneumothorax. No pulmonary nodule or mass noted. Pulmonary vasculature and the cardiomediastinal silhouette are within normal limits. IMPRESSION: No radiographic evidence of acute cardiopulmonary disease. Electronically Signed   By: Trudie Reedaniel  Entrikin M.D.   On: 08/27/2015 12:29   Ct Head Wo Contrast  08/27/2015  CLINICAL DATA:  Patient found on floor, possible lupus flare up. Altered mental status. EXAM: CT HEAD WITHOUT CONTRAST TECHNIQUE: Contiguous axial images were obtained from the base of the skull  through the vertex without intravenous contrast. COMPARISON:  None. FINDINGS: No evidence for acute infarction, hemorrhage, mass lesion, hydrocephalus, or extra-axial fluid. No atrophy or white matter disease. No CT features of large vessel occlusion. No evidence for chronic large vessel or lacunar infarct. Intact calvarium. No acute sinus or mastoid disease. IMPRESSION: Negative. Electronically Signed   By: Elsie Stain M.D.   On: 08/27/2015 15:53   I have personally reviewed and evaluated these images and lab results as part of my medical decision-making.   EKG Interpretation None      MDM   Final diagnoses:  Acute nonintractable headache, unspecified headache type  Altered awareness, transient    Labs: I-STAT troponin, BMP, CBC- no significant findings  Imaging: CT head without contrast negative  Consults:  Therapeutics: Toradol, Reglan, Benadryl  Discharge Meds:   Assessment/Plan: Patient presents with transient altered level of awareness. The patient describes sounds similar to panic or anxiety attack although this could have likely been a seizure. Chart review shows somewhat similar presentation with no significant findings previously. Patient has CT  scan here, laboratory evaluation during, vital signs reassuring. She was treated for migraine which is similar to previous, no red flags. She had good symptomatic improvement, was at her baseline mental status and had no acute complaints at the time of discharge. Patient has no neurological deficits. Very low suspicion for subarachnoid hemorrhage as patient had no headache prior to incident, slow onset, headache similar to previous. She will be discharged home with her daughters instructed to follow-up with her primary care provider for reevaluation tomorrow. She is given strict return precautions, verbalized understanding and agreement for today's plan and had no further questions and concerns at time of discharge.         Eyvonne Mechanic, PA-C 08/27/15 1634  Nelva Nay, MD 08/30/15 [phone]

## 2015-10-05 ENCOUNTER — Encounter: Payer: Self-pay | Admitting: Neurology

## 2015-10-05 ENCOUNTER — Ambulatory Visit (INDEPENDENT_AMBULATORY_CARE_PROVIDER_SITE_OTHER): Payer: BLUE CROSS/BLUE SHIELD | Admitting: Neurology

## 2015-10-05 VITALS — BP 101/70 | HR 94 | Ht 59.0 in | Wt 133.0 lb

## 2015-10-05 DIAGNOSIS — G8929 Other chronic pain: Secondary | ICD-10-CM | POA: Diagnosis not present

## 2015-10-05 DIAGNOSIS — G43909 Migraine, unspecified, not intractable, without status migrainosus: Secondary | ICD-10-CM | POA: Insufficient documentation

## 2015-10-05 DIAGNOSIS — G43009 Migraine without aura, not intractable, without status migrainosus: Secondary | ICD-10-CM

## 2015-10-05 DIAGNOSIS — M542 Cervicalgia: Secondary | ICD-10-CM | POA: Diagnosis not present

## 2015-10-05 MED ORDER — SUMATRIPTAN SUCCINATE 50 MG PO TABS: 50.0000 mg | ORAL_TABLET | ORAL | Status: AC | PRN

## 2015-10-05 MED ORDER — NORTRIPTYLINE HCL 25 MG PO CAPS: ORAL_CAPSULE | ORAL | Status: AC

## 2015-10-05 NOTE — Progress Notes (Signed)
PATIENT: Rachel Hammond DOB: 1971/06/11  Chief Complaint  Patient presents with  . Seizures    She presented to the ED for seizure-like activity on 08/26/16. She had been feeling tired and noticed her hands shaking that morning prior to work.  Later in the day, her coworkers witnessed an episode of body shaking, teeth chattering and she was unable to respond to questions.  She has recollection of the episode until later in the day when she woke up in the hospital.  She had a previous seizure in 2008 and was placed on topiramate (currently taking  at bedtime).     HISTORICAL  Rachel Hammond is a 45 years old right-handed female, alone at today's clinical visit, seen in refer by  her primary care doctor Jackie Plum October 05 2015 for evaluation of seizure-like event  I have reviewed and summarized her referring note, she had a history of chronic pain, hypertension, migraine,  She had one seizure-like activity in 2008, also with frequent severe headaches, she has been treated with Topamax 100 mg every night ever since then, she also carries a diagnosis of lupus, sickle cell trait, insomnia,  She only had intermittent migraine the past, but since November 2016, she began to have almost daily, constant bilateral temporal, frontal vortex area pressure pain, neck pain, 9 out of 10, radiating pain to bilateral shoulder, it is difficult for her to concentrate, noise sensitivity.  She denies gait difficulty, does carry a diagnosis of lupus, complains of body achy pain she denies bowel and bladder incontinence  Laboratory evaluation in January 2017 showed mild elevated fasting lipid profile, elevated glucose, otherwise normal vitamin D, TSH,   REVIEW OF SYSTEMS: Full 14 system review of systems performed and notable only for weight gain, fatigue, chest pain, blurred vision, eye pain, anemia, easy bruising, constipation, feeling cold, joint pain, cramps, aching muscles, headaches, numbness,  weakness, dizziness, passing out, decreased energy  ALLERGIES: Allergies  Allergen Reactions  . Codeine Anaphylaxis and Swelling    HOME MEDICATIONS: Current Outpatient Prescriptions  Medication Sig Dispense Refill  . amitriptyline (ELAVIL) 50 MG tablet Take 1 tablet (50 mg total) by mouth at bedtime. 10 tablet 1  . diclofenac (VOLTAREN) 75 MG EC tablet 1 every 12 hours as needed for headache (Patient not taking: Reported on 08/06/2014) 30 tablet 0  . DULoxetine (CYMBALTA) 60 MG capsule Take 60 mg by mouth 2 (two) times daily.    . ergocalciferol (VITAMIN D2) 50000 UNITS capsule Take 50,000 Units by mouth once a week. Wednesday    . HYDROcodone-acetaminophen (NORCO/VICODIN) 5-325 MG per tablet Take 1-2 tablets every 6 hours as needed for severe pain (Patient taking differently: Take 1 tablet by mouth 3 (three) times daily. ) 12 tablet 0  . ibuprofen (ADVIL,MOTRIN) 800 MG tablet Take 800 mg by mouth 3 (three) times daily.    . Linaclotide (LINZESS) 290 MCG CAPS capsule Take 290 mcg by mouth daily.    . methocarbamol (ROBAXIN) 500 MG tablet Take 2 tablets (1,000 mg total) by mouth 4 (four) times daily. (Patient not taking: Reported on 08/06/2014) 20 tablet 0  . metoCLOPramide (REGLAN) 10 MG tablet 1 every 12 hours as needed for headache (Patient not taking: Reported on 08/06/2014) 30 tablet 0  . nabumetone (RELAFEN) 500 MG tablet Take 500 mg by mouth 2 (two) times daily.    . phentermine (ADIPEX-P) 37.5 MG tablet Take 37.5 mg by mouth daily.    . pregabalin (LYRICA) 75 MG capsule Take  75 mg by mouth 2 (two) times daily.    Marland Kitchen PRESCRIPTION MEDICATION Take 1 capsule by mouth 2 (two) times a week. For IBS    . topiramate (TOPAMAX) 100 MG tablet Take 100 mg by mouth 2 (two) times daily.     No current facility-administered medications for this visit.    PAST MEDICAL HISTORY: Past Medical History  Diagnosis Date  . Sickle cell trait (HCC)   . Anemia   . IBS (irritable bowel syndrome)   .  Lupus (HCC)   . Migraine   . Seizure (HCC)   . Hyperlipemia     PAST SURGICAL HISTORY: Past Surgical History  Procedure Laterality Date  . Cesarean section  1993 and 1998    x 2  . Cholecystectomy  1991  . Abdominal hysterectomy      FAMILY HISTORY: Family History  Problem Relation Age of Onset  . Diabetes Mother     gangrene  . Cancer Father     brain  . Heart failure Mother   . Stroke Mother   . Parkinson's disease Paternal Grandmother     SOCIAL HISTORY:  Social History   Social History  . Marital Status: Single    Spouse Name: N/A  . Number of Children: 3  . Years of Education: 14   Occupational History  . Pre-school teacher    Social History Main Topics  . Smoking status: Never Smoker   . Smokeless tobacco: Not on file  . Alcohol Use: No  . Drug Use: No  . Sexual Activity: Yes    Birth Control/ Protection: None   Other Topics Concern  . Not on file   Social History Narrative   Lives at home with daughters and finacee.   Right-handed.   No caffeine use.     PHYSICAL EXAM   Filed Vitals:   10/05/15 1528  BP: 101/70  Pulse: 94  Height: 4\' 11"  (1.499 m)  Weight: 133 lb (60.328 kg)    Not recorded      Body mass index is 26.85 kg/(m^2).  PHYSICAL EXAMNIATION: Depressed looking middle-age female  Gen: NAD, conversant, well nourised, obese, well groomed                     Cardiovascular: Regular rate rhythm, no peripheral edema, warm, nontender. Eyes: Conjunctivae clear without exudates or hemorrhage Neck: Supple, no carotid bruise. Pulmonary: Clear to auscultation bilaterally   NEUROLOGICAL EXAM:  MENTAL STATUS: Speech:    Speech is normal; fluent and spontaneous with normal comprehension.  Cognition:     Orientation to time, place and person     Normal recent and remote memory     Normal Attention span and concentration     Normal Language, naming, repeating,spontaneous speech     Fund of knowledge   CRANIAL NERVES: CN II:  Visual fields are full to confrontation. Fundoscopic exam is normal with sharp discs and no vascular changes. Pupils are round equal and briskly reactive to light. CN III, IV, VI: extraocular movement are normal. No ptosis. CN V: Facial sensation is intact to pinprick in all 3 divisions bilaterally. Corneal responses are intact.  CN VII: Face is symmetric with normal eye closure and smile. CN VIII: Hearing is normal to rubbing fingers CN IX, X: Palate elevates symmetrically. Phonation is normal. CN XI: Head turning and shoulder shrug are intact CN XII: Tongue is midline with normal movements and no atrophy.  MOTOR: There is no pronator drift  of out-stretched arms. Muscle bulk and tone are normal. Muscle strength is normal.  REFLEXES: Reflexes are 2+ and symmetric at the biceps, triceps, knees, and ankles. Plantar responses are flexor.  SENSORY: Intact to light touch, pinprick, position sense, and vibration sense are intact in fingers and toes.  COORDINATION: Rapid alternating movements and fine finger movements are intact. There is no dysmetria on finger-to-nose and heel-knee-shin.    GAIT/STANCE: Posture is normal. Gait is steady with normal steps, base, arm swing, and turning. Heel and toe walking are normal. Tandem gait is normal.  Romberg is absent.   DIAGNOSTIC DATA (LABS, IMAGING, TESTING) - I reviewed patient records, labs, notes, testing and imaging myself where available.   ASSESSMENT AND PLAN  Anum Palecek is a 45 y.o. female    Chronic migraine Neck pain  Proceed with nortriptyline, 25 mg every night titrating to 2 tablets every night  Imitrex 50 mg as needed  Levert Feinstein, M.D. Ph.D.  [facility] [address], [address] [place], [place] [address] Ph: [phone] Fax: [phone]  CC: Jackie Plum, MD

## 2015-11-16 ENCOUNTER — Telehealth: Payer: Self-pay | Admitting: *Deleted

## 2015-11-16 ENCOUNTER — Ambulatory Visit: Payer: BLUE CROSS/BLUE SHIELD | Admitting: Neurology

## 2015-11-16 NOTE — Telephone Encounter (Signed)
No showed follow up appointment. 

## 2015-11-17 ENCOUNTER — Encounter: Payer: Self-pay | Admitting: Neurology

## 2016-09-21 IMAGING — US US ABDOMEN COMPLETE
1 series · 14 of 25 positions shown · non-contrast
Comparison: None.

CLINICAL DATA: Abdominal pain for 3 weeks.

EXAM:
ULTRASOUND ABDOMEN COMPLETE

[Series 1: us abdomen complete · 0.21mm/px · 14 of 57 slices shown]
[im 1/57]
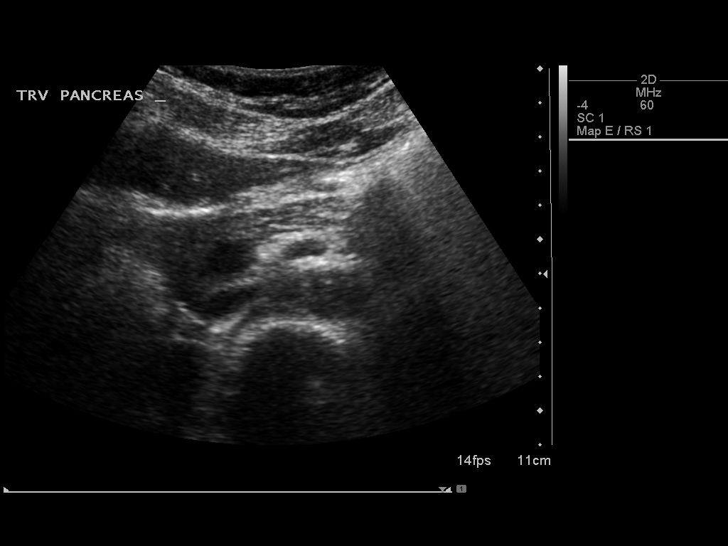
[im 5/57]
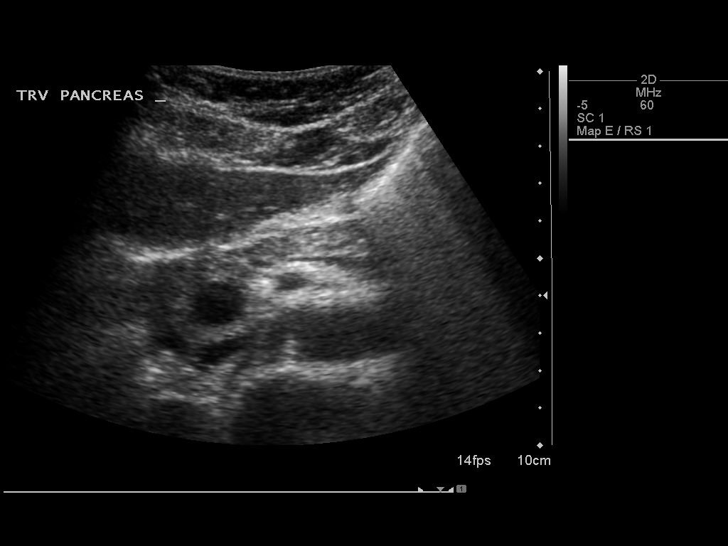
[im 10/57]
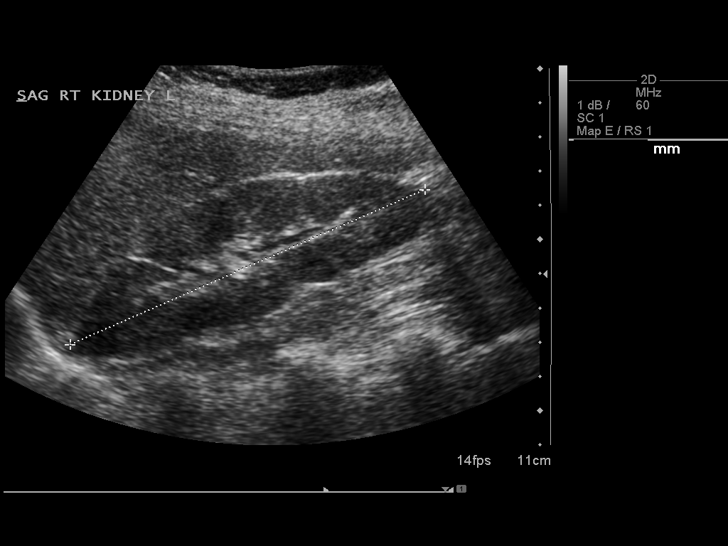
[im 15/57]
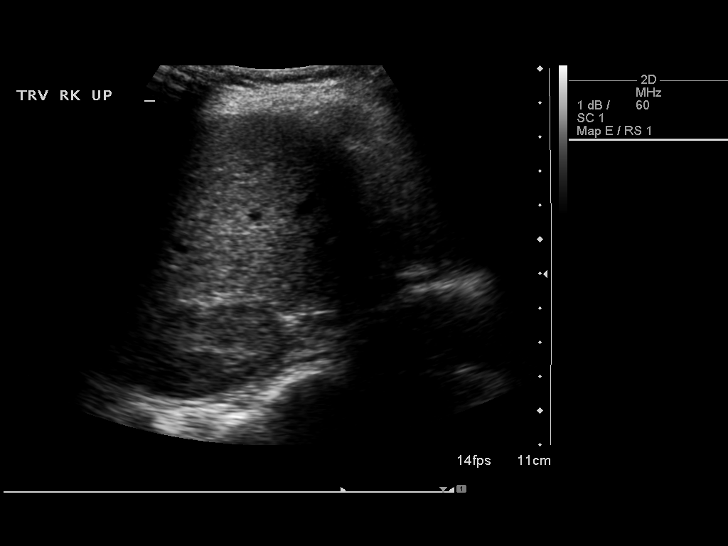
[im 19/57]
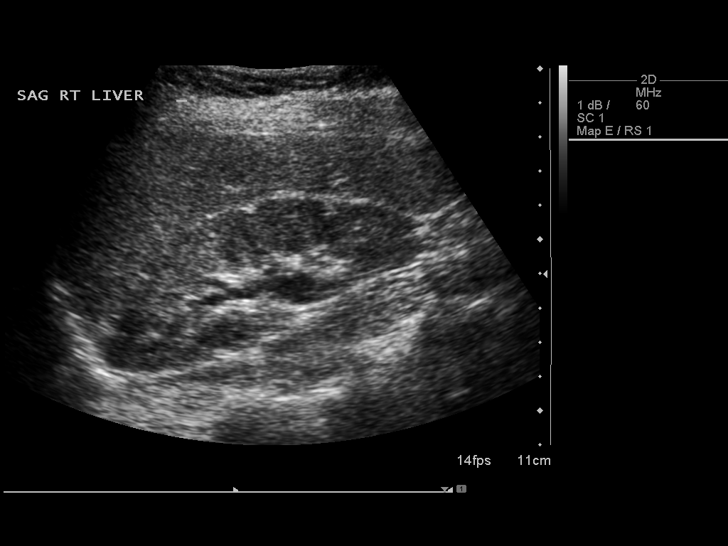
[im 22/57]
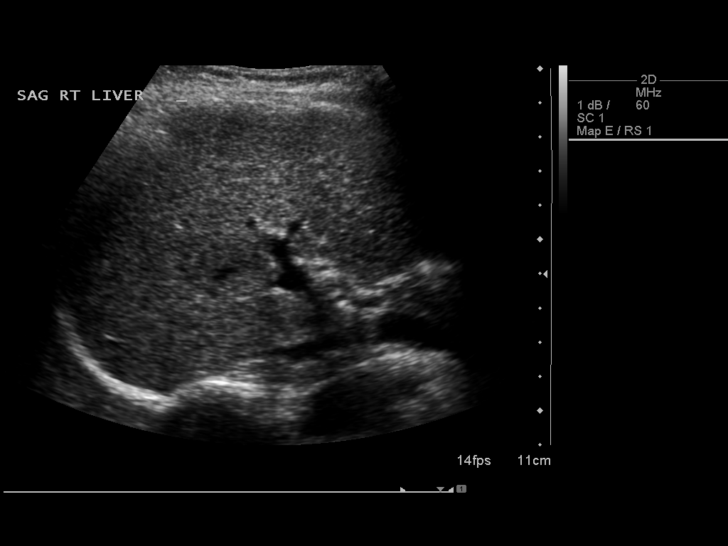
[im 26/57]
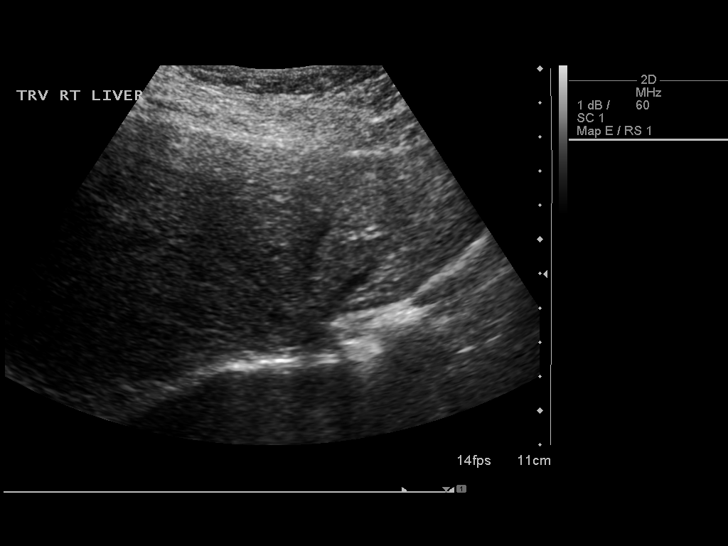
[im 31/57]
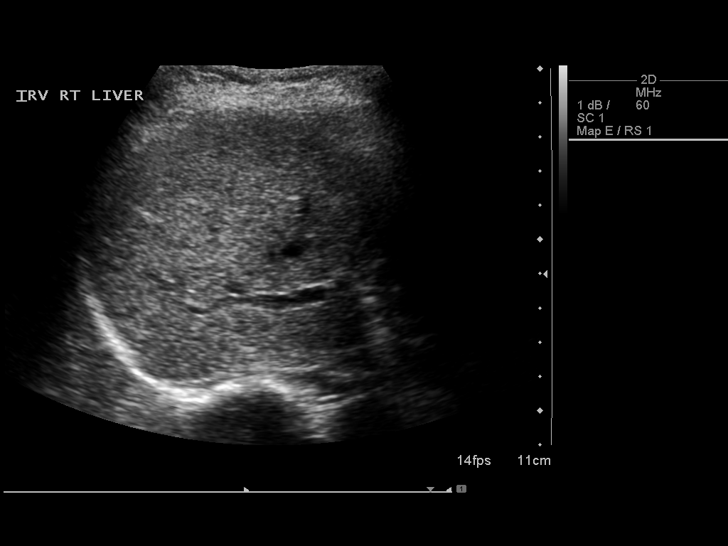
[im 36/57]
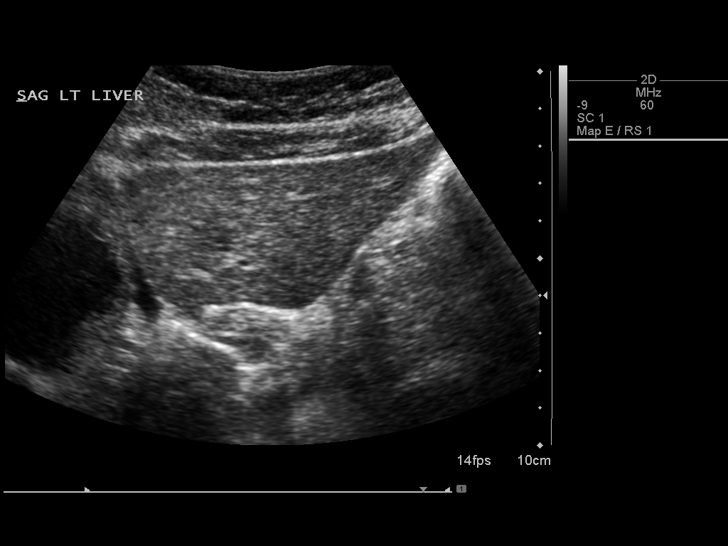
[im 38/57]
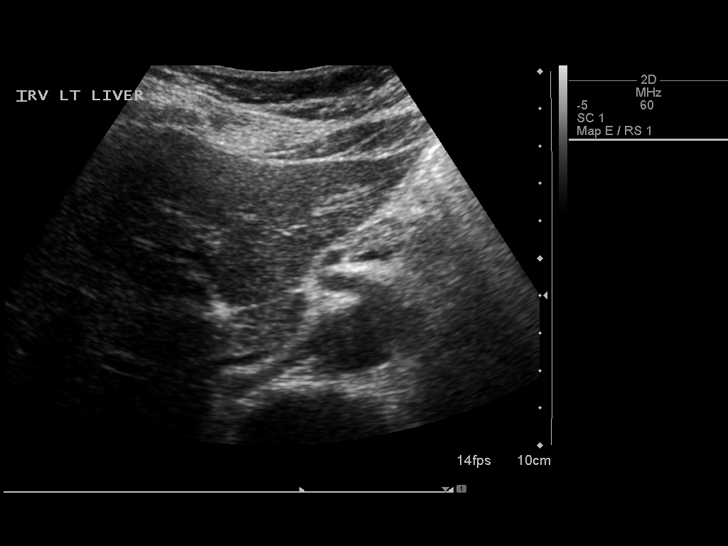
[im 43/57]
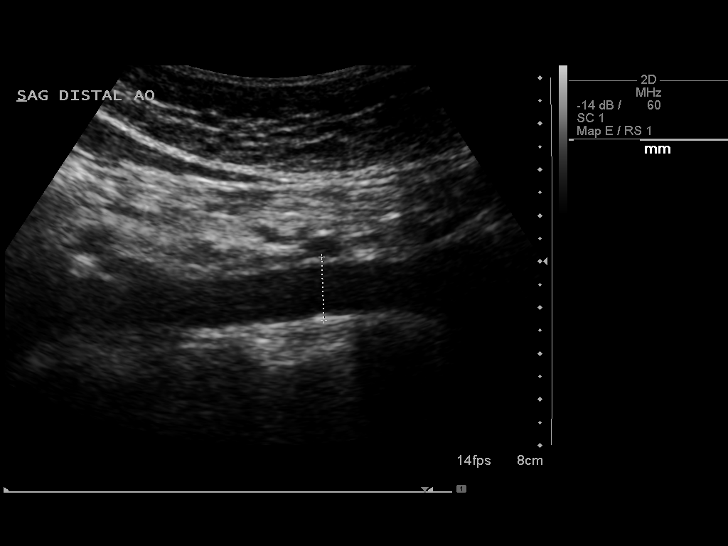
[im 47/57]
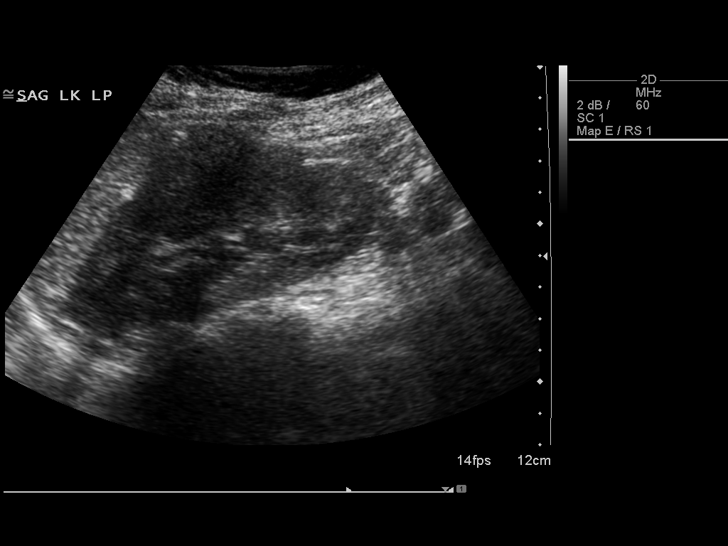
[im 52/57]
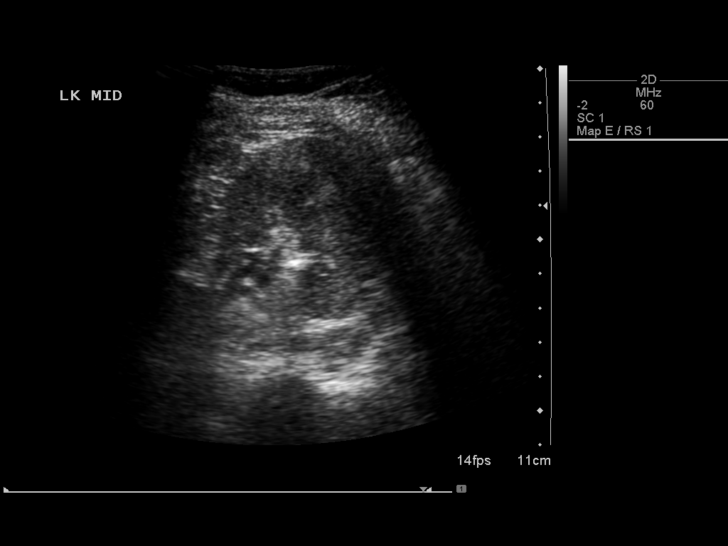
[im 57/57]
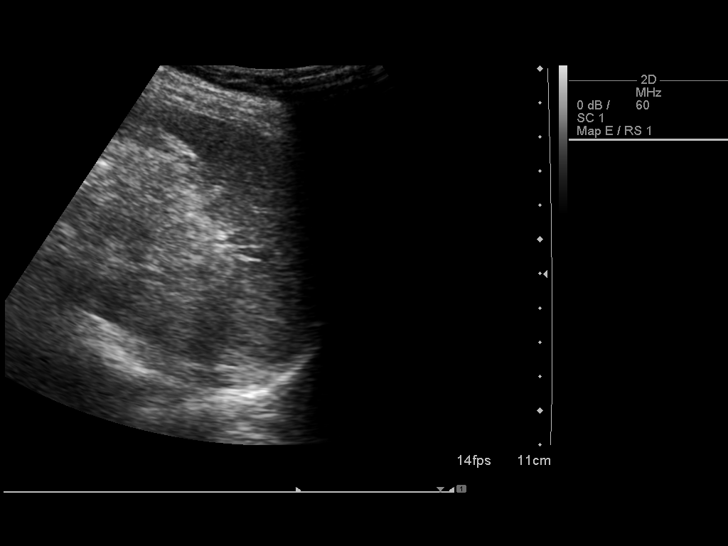

[14 of 25 positions shown; findings below may reference images not displayed]

FINDINGS: Gallbladder: Cholecystectomy.

Common bile duct: Diameter: 5.1 mm

Liver: No focal lesion identified. Within normal limits in
parenchymal echogenicity.

IVC: No abnormality visualized.

Pancreas: Visualized portion unremarkable.

Spleen: Size and appearance within normal limits.

Right Kidney: Length: 11.2 cm. Echogenicity within normal limits. No
mass or hydronephrosis visualized.

Left Kidney: Length: 11.4 cm. Echogenicity within normal limits. No
mass or hydronephrosis visualized.

Abdominal aorta: No aneurysm visualized.

Other findings: None.
IMPRESSION: Normal exam.

## 2016-11-29 ENCOUNTER — Other Ambulatory Visit: Payer: Self-pay | Admitting: Internal Medicine

## 2016-11-29 DIAGNOSIS — R102 Pelvic and perineal pain: Secondary | ICD-10-CM

## 2016-12-08 ENCOUNTER — Ambulatory Visit
Admission: RE | Admit: 2016-12-08 | Discharge: 2016-12-08 | Disposition: A | Discharge status: Home / Self Care | Payer: Medicaid Other | Source: Ambulatory Visit | Attending: Internal Medicine | Admitting: Internal Medicine

## 2016-12-08 ENCOUNTER — Ambulatory Visit
Admission: RE | Admit: 2016-12-08 | Discharge: 2016-12-08 | Disposition: A | Discharge status: Home / Self Care | Payer: BLUE CROSS/BLUE SHIELD | Source: Ambulatory Visit | Attending: Internal Medicine | Admitting: Internal Medicine

## 2016-12-08 DIAGNOSIS — R102 Pelvic and perineal pain: Secondary | ICD-10-CM

## 2019-07-18 ENCOUNTER — Other Ambulatory Visit: Payer: Self-pay

## 2019-07-18 DIAGNOSIS — Z20822 Contact with and (suspected) exposure to covid-19: Secondary | ICD-10-CM

## 2019-07-20 LAB — NOVEL CORONAVIRUS, NAA: SARS-CoV-2, NAA: NOT DETECTED

## 2021-09-13 ENCOUNTER — Emergency Department (HOSPITAL_COMMUNITY): Payer: 59

## 2021-09-13 ENCOUNTER — Encounter: Payer: Self-pay | Admitting: Emergency Medicine

## 2021-09-13 ENCOUNTER — Encounter (HOSPITAL_COMMUNITY): Payer: Self-pay

## 2021-09-13 ENCOUNTER — Other Ambulatory Visit: Payer: Self-pay

## 2021-09-13 ENCOUNTER — Ambulatory Visit
Admission: EM | Admit: 2021-09-13 | Discharge: 2021-09-13 | Disposition: A | Discharge status: Home / Self Care | Payer: 59

## 2021-09-13 ENCOUNTER — Emergency Department (HOSPITAL_COMMUNITY)
Admission: EM | Admit: 2021-09-13 | Discharge: 2021-09-13 | Disposition: A | Discharge status: Home / Self Care | Payer: 59 | Attending: Emergency Medicine | Admitting: Emergency Medicine

## 2021-09-13 DIAGNOSIS — R109 Unspecified abdominal pain: Secondary | ICD-10-CM | POA: Insufficient documentation

## 2021-09-13 DIAGNOSIS — Z5321 Procedure and treatment not carried out due to patient leaving prior to being seen by health care provider: Secondary | ICD-10-CM | POA: Diagnosis not present

## 2021-09-13 DIAGNOSIS — R110 Nausea: Secondary | ICD-10-CM | POA: Diagnosis not present

## 2021-09-13 HISTORY — DX: Systemic involvement of connective tissue, unspecified: M35.9

## 2021-09-13 LAB — CBC
HCT: 40.6 % (ref 36.0–46.0)
Hemoglobin: 13.4 g/dL (ref 12.0–15.0)
MCH: 27 pg (ref 26.0–34.0)
MCHC: 33 g/dL (ref 30.0–36.0)
MCV: 81.7 fL (ref 80.0–100.0)
Platelets: 264 10*3/uL (ref 150–400)
RBC: 4.97 MIL/uL (ref 3.87–5.11)
RDW: 14.4 % (ref 11.5–15.5)
WBC: 8.7 10*3/uL (ref 4.0–10.5)
nRBC: 0 % (ref 0.0–0.2)

## 2021-09-13 LAB — COMPREHENSIVE METABOLIC PANEL
ALT: 13 U/L (ref 0–44)
AST: 18 U/L (ref 15–41)
Albumin: 4.3 g/dL (ref 3.5–5.0)
Alkaline Phosphatase: 69 U/L (ref 38–126)
Anion gap: 8 (ref 5–15)
BUN: 11 mg/dL (ref 6–20)
CO2: 23 mmol/L (ref 22–32)
Calcium: 8.9 mg/dL (ref 8.9–10.3)
Chloride: 103 mmol/L (ref 98–111)
Creatinine, Ser: 0.53 mg/dL (ref 0.44–1.00)
GFR, Estimated: >60 mL/min (ref >60)
Glucose, Bld: 90 mg/dL (ref 70–99)
Potassium: 3.5 mmol/L (ref 3.5–5.1)
Sodium: 134 mmol/L — ABNORMAL LOW (ref 135–145)
Total Bilirubin: 0.8 mg/dL (ref 0.3–1.2)
Total Protein: 8.3 g/dL — ABNORMAL HIGH (ref 6.5–8.1)

## 2021-09-13 LAB — LIPASE, BLOOD: Lipase: 28 U/L (ref 11–51)

## 2021-09-13 MED ORDER — IOHEXOL 300 MG/ML  SOLN: 100.0000 mL | Freq: Once | INTRAMUSCULAR | Status: DC | PRN

## 2021-09-13 NOTE — ED Provider Notes (Signed)
Patient presents to UC today with abdominal pain that she reports is similar to that she experienced when her "intestines erupted". Given significant PMH, lower BP in office, recommended further evaluation in the ED where she can receive stat labs and imaging. Patient is agreeable to same, husband who is with her today will drive her.    Francene Finders, PA-C 09/13/21 1439

## 2021-09-13 NOTE — ED Triage Notes (Signed)
Generalized abdominal pain radiating into back starting 2 days prior worsening into today. Hx of IBS. States last time she felt like this "my intestines had erupted."

## 2021-09-13 NOTE — ED Triage Notes (Signed)
Patient c/o generalized abdominal pain that radiates into the lower back. Patient also c/o nausea. Patient was at the [facility] and was referred to the ED.

## 2021-09-13 NOTE — ED Provider Triage Note (Signed)
Emergency Medicine Provider Triage Evaluation Note  Rachel Hammond , a 51 y.o. female  was evaluated in triage.  Pt complains of visual onset, constant, worsening, diffuse abdominal pain for the past 2 to 3 days.  She also complains of nausea and chills.  Her last normal bowel movement was 2 days ago.  She reports history of perforated diverticulitis and states that this feels similar.  She went to urgent cares and was advised to come to the ED for further evaluation.  She denies any fevers. .  Review of Systems  Positive: + abd pain, nausea, constipation, chills Negative: - fevers, vomiting  Physical Exam  BP 97/75 (BP Location: Left Arm)    Pulse 66    Temp 98.3 F (36.8 C) (Oral)    Resp 16    Ht 4\' 11"  (1.499 m)    Wt 58.5 kg    LMP 07/19/2014 (Approximate)    SpO2 100%    BMI 26.05 kg/m  Gen:   Awake, no distress   Resp:  Normal effort  MSK:   Moves extremities without difficulty  Other:  Diffuse abd TTP however worse in LLQ  Medical Decision Making  Medically screening exam initiated at 3:41 PM.  Appropriate orders placed.  Rachel Hammond was informed that the remainder of the evaluation will be completed by another provider, this initial triage assessment does not replace that evaluation, and the importance of remaining in the ED until their evaluation is complete.     Magnus Ivan, PA-C 09/13/21 1542

## 2021-09-14 ENCOUNTER — Observation Stay (HOSPITAL_BASED_OUTPATIENT_CLINIC_OR_DEPARTMENT_OTHER)
Admission: EM | Admit: 2021-09-14 | Discharge: 2021-09-15 | Disposition: A | Discharge status: Home / Self Care | Payer: 59 | Attending: General Surgery | Admitting: General Surgery

## 2021-09-14 ENCOUNTER — Other Ambulatory Visit: Payer: Self-pay

## 2021-09-14 ENCOUNTER — Emergency Department (HOSPITAL_BASED_OUTPATIENT_CLINIC_OR_DEPARTMENT_OTHER): Payer: 59

## 2021-09-14 ENCOUNTER — Encounter (HOSPITAL_BASED_OUTPATIENT_CLINIC_OR_DEPARTMENT_OTHER): Payer: Self-pay

## 2021-09-14 DIAGNOSIS — M359 Systemic involvement of connective tissue, unspecified: Secondary | ICD-10-CM | POA: Insufficient documentation

## 2021-09-14 DIAGNOSIS — Z79899 Other long term (current) drug therapy: Secondary | ICD-10-CM | POA: Insufficient documentation

## 2021-09-14 DIAGNOSIS — R109 Unspecified abdominal pain: Secondary | ICD-10-CM | POA: Diagnosis present

## 2021-09-14 DIAGNOSIS — M329 Systemic lupus erythematosus, unspecified: Secondary | ICD-10-CM | POA: Insufficient documentation

## 2021-09-14 DIAGNOSIS — Z9049 Acquired absence of other specified parts of digestive tract: Secondary | ICD-10-CM

## 2021-09-14 DIAGNOSIS — N9489 Other specified conditions associated with female genital organs and menstrual cycle: Secondary | ICD-10-CM

## 2021-09-14 DIAGNOSIS — G43909 Migraine, unspecified, not intractable, without status migrainosus: Secondary | ICD-10-CM | POA: Diagnosis present

## 2021-09-14 DIAGNOSIS — K353 Acute appendicitis with localized peritonitis, without perforation or gangrene: Principal | ICD-10-CM | POA: Insufficient documentation

## 2021-09-14 DIAGNOSIS — Z20822 Contact with and (suspected) exposure to covid-19: Secondary | ICD-10-CM | POA: Insufficient documentation

## 2021-09-14 DIAGNOSIS — E876 Hypokalemia: Secondary | ICD-10-CM | POA: Diagnosis present

## 2021-09-14 DIAGNOSIS — D649 Anemia, unspecified: Secondary | ICD-10-CM | POA: Diagnosis present

## 2021-09-14 DIAGNOSIS — K3580 Unspecified acute appendicitis: Secondary | ICD-10-CM

## 2021-09-14 LAB — CBC WITH DIFFERENTIAL/PLATELET
Abs Immature Granulocytes: 0.02 10*3/uL (ref 0.00–0.07)
Basophils Absolute: 0 10*3/uL (ref 0.0–0.1)
Basophils Relative: 0 %
Eosinophils Absolute: 0.1 10*3/uL (ref 0.0–0.5)
Eosinophils Relative: 2 %
HCT: 32.4 % — ABNORMAL LOW (ref 36.0–46.0)
Hemoglobin: 10.6 g/dL — ABNORMAL LOW (ref 12.0–15.0)
Immature Granulocytes: 0 %
Lymphocytes Relative: 32 %
Lymphs Abs: 2.1 10*3/uL (ref 0.7–4.0)
MCH: 26.4 pg (ref 26.0–34.0)
MCHC: 32.7 g/dL (ref 30.0–36.0)
MCV: 80.6 fL (ref 80.0–100.0)
Monocytes Absolute: 0.6 10*3/uL (ref 0.1–1.0)
Monocytes Relative: 9 %
Neutro Abs: 3.8 10*3/uL (ref 1.7–7.7)
Neutrophils Relative %: 57 %
Platelets: 231 10*3/uL (ref 150–400)
RBC: 4.02 MIL/uL (ref 3.87–5.11)
RDW: 14 % (ref 11.5–15.5)
WBC: 6.8 10*3/uL (ref 4.0–10.5)
nRBC: 0 % (ref 0.0–0.2)

## 2021-09-14 LAB — COMPREHENSIVE METABOLIC PANEL
ALT: 8 U/L (ref 0–44)
AST: 13 U/L — ABNORMAL LOW (ref 15–41)
Albumin: 3.7 g/dL (ref 3.5–5.0)
Alkaline Phosphatase: 70 U/L (ref 38–126)
Anion gap: 9 (ref 5–15)
BUN: 11 mg/dL (ref 6–20)
CO2: 25 mmol/L (ref 22–32)
Calcium: 8.4 mg/dL — ABNORMAL LOW (ref 8.9–10.3)
Chloride: 104 mmol/L (ref 98–111)
Creatinine, Ser: 0.63 mg/dL (ref 0.44–1.00)
GFR, Estimated: >60 mL/min (ref >60)
Glucose, Bld: 83 mg/dL (ref 70–99)
Potassium: 3.4 mmol/L — ABNORMAL LOW (ref 3.5–5.1)
Sodium: 138 mmol/L (ref 135–145)
Total Bilirubin: 0.3 mg/dL (ref 0.3–1.2)
Total Protein: 6.7 g/dL (ref 6.5–8.1)

## 2021-09-14 LAB — LACTIC ACID, PLASMA: Lactic Acid, Venous: 0.6 mmol/L (ref 0.5–1.9)

## 2021-09-14 LAB — URINALYSIS, ROUTINE W REFLEX MICROSCOPIC
Bilirubin Urine: NEGATIVE
Glucose, UA: NEGATIVE mg/dL
Ketones, ur: NEGATIVE mg/dL
Nitrite: NEGATIVE
Specific Gravity, Urine: 1.022 (ref 1.005–1.030)
pH: 5.5 (ref 5.0–8.0)

## 2021-09-14 LAB — RESP PANEL BY RT-PCR (FLU A&B, COVID) ARPGX2
Influenza A by PCR: NEGATIVE
Influenza B by PCR: NEGATIVE
SARS Coronavirus 2 by RT PCR: NEGATIVE

## 2021-09-14 LAB — LIPASE, BLOOD: Lipase: 28 U/L (ref 11–51)

## 2021-09-14 MED ORDER — FENTANYL CITRATE PF 50 MCG/ML IJ SOSY
50.0000 ug | PREFILLED_SYRINGE | INTRAMUSCULAR | Status: DC | PRN
  Administered 2021-09-14: 50 ug via INTRAVENOUS
  Filled 2021-09-14 (×2): qty 1

## 2021-09-14 MED ORDER — FENTANYL CITRATE PF 50 MCG/ML IJ SOSY
100.0000 ug | PREFILLED_SYRINGE | Freq: Once | INTRAMUSCULAR | Status: AC
  Administered 2021-09-14: 100 ug via INTRAVENOUS
  Filled 2021-09-14: qty 2

## 2021-09-14 MED ORDER — IOHEXOL 300 MG/ML  SOLN
100.0000 mL | Freq: Once | INTRAMUSCULAR | Status: AC | PRN
  Administered 2021-09-14: 75 mL via INTRAVENOUS

## 2021-09-14 MED ORDER — LACTATED RINGERS IV BOLUS
1000.0000 mL | Freq: Once | INTRAVENOUS | Status: AC
  Administered 2021-09-14: 1000 mL via INTRAVENOUS

## 2021-09-14 MED ORDER — METOCLOPRAMIDE HCL 5 MG/ML IJ SOLN
10.0000 mg | Freq: Once | INTRAMUSCULAR | Status: AC
Start: 2021-09-14 — End: 2021-09-14
  Administered 2021-09-14: 10 mg via INTRAVENOUS
  Filled 2021-09-14: qty 2

## 2021-09-14 MED ORDER — PIPERACILLIN-TAZOBACTAM 3.375 G IVPB 30 MIN
3.3750 g | Freq: Once | INTRAVENOUS | Status: AC
  Administered 2021-09-14: 3.375 g via INTRAVENOUS
  Filled 2021-09-14: qty 50

## 2021-09-14 MED ORDER — ONDANSETRON HCL 4 MG/2ML IJ SOLN
4.0000 mg | Freq: Once | INTRAMUSCULAR | Status: AC
Start: 2021-09-14 — End: 2021-09-14
  Administered 2021-09-14: 4 mg via INTRAVENOUS
  Filled 2021-09-14: qty 2

## 2021-09-14 NOTE — Progress Notes (Signed)
Plan of Care Note for accepted transfer   Patient: Rachel Hammond MRN: [id number]   DOA: 09/14/2021  Facility requesting transfer: [facility] Requesting Provider: Dr. Karene Fry Reason for transfer: Acute Appendicitis Facility course:   51 year old female with past medical history of lupus (on Plaquenil, follows with [facility]), migraine headaches presenting [facility] with abdominal pain.  Upon evaluation CT imaging of the abdomen and pelvis reveals acute appendicitis.  ER provider discussed case with Dr. Andrey Campanile with general surgery who request that medicine admit due to presence of lupus and that patient can be kept n.p.o. after midnight for potential surgical intervention.  Patient has been administered fentanyl, Zofran, intravenous Zosyn and 1 L of isotonic fluids.  Upon my review of CT imaging report there is also a 5.2 x 4.6 cm cystic structure in the region of the right adnexa adjacent to the appendix which likely needs further imaging with a pelvic ultrasound.   Plan of care: The patient is accepted for admission to Med-surg  unit, at [facility]..    Author: Marinda Elk, MD 09/14/2021  Check [email or web address] for on-call coverage.  Nursing staff, Please call [facility] Admits & Consults System-Wide number on Amion as soon as patient's arrival, so appropriate admitting provider can evaluate the pt.

## 2021-09-14 NOTE — ED Notes (Signed)
Patient transported to CT 

## 2021-09-14 NOTE — ED Provider Notes (Signed)
[facility] EMERGENCY DEPT Provider Note   CSN: [id number] Arrival date & time: 09/14/21  1910     History  Chief Complaint  Patient presents with   Abdominal Pain    Rachel Hammond is a 51 y.o. female.   Abdominal Pain Associated symptoms: nausea    51 year old female presenting to the emergency department from home with a complaint of abdominal pain.  Patient states that the pain has been present since this past Saturday but worsened today. Pain started in the epigastrium/periumbilical area and has spread to become more diffuse. She now endorses diffuse abdominal pain and nausea . She denies and fevers or chills. She has a history of IBS. She has a history of diverticulitis and sigmoidectomy. She is not sure when she last passed gas.   Home Medications Prior to Admission medications   Medication Sig Start Date End Date Taking? Authorizing Provider  cyclobenzaprine (FLEXERIL) 10 MG tablet Take 10 mg by mouth 3 (three) times daily as needed for muscle spasms.    [provider]  DULoxetine (CYMBALTA) 60 MG capsule Take 60 mg by mouth 2 (two) times daily.    [provider]  ergocalciferol (VITAMIN D2) 50000 UNITS capsule Take 50,000 Units by mouth once a week. Wednesday    [provider]  fluticasone (FLONASE) 50 MCG/ACT nasal spray Place 1 spray into both nostrils daily.    [provider]  HYDROcodone-acetaminophen (NORCO/VICODIN) 5-325 MG per tablet Take 1-2 tablets every 6 hours as needed for severe pain Patient taking differently: Take 1 tablet by mouth 3 (three) times daily.  11/17/13   Carlisle Cater, PA-C  ibuprofen (ADVIL,MOTRIN) 800 MG tablet Take 800 mg by mouth 3 (three) times daily.    [provider]  ketorolac (TORADOL) 10 MG tablet Take 10 mg by mouth every 6 (six) hours as needed. Take one tab four times daily x 5 days.    [provider]  Linaclotide (LINZESS) 290 MCG CAPS capsule Take 290 mcg by  mouth daily.    [provider]  metoCLOPramide (REGLAN) 10 MG tablet 1 every 12 hours as needed for headache 05/05/14   Harden Mo, MD  nortriptyline (PAMELOR) 25 MG capsule One po qhs xone week, then 2 tabs po qhs 10/05/15   Marcial Pacas, MD  phentermine (ADIPEX-P) 37.5 MG tablet Take 37.5 mg by mouth daily.    [provider]  PRESCRIPTION MEDICATION Take 1 capsule by mouth 2 (two) times a week. For IBS    [provider]  SUMAtriptan (IMITREX) 50 MG tablet Take 1 tablet (50 mg total) by mouth every 2 (two) hours as needed for migraine. May repeat in 2 hours if headache persists or recurs. 10/05/15   Marcial Pacas, MD  topiramate (TOPAMAX) 100 MG tablet Take 100 mg by mouth daily.     [provider]      Allergies    Codeine    Review of Systems   Review of Systems  Gastrointestinal:  Positive for abdominal pain and nausea.  All other systems reviewed and are negative.  Physical Exam Updated Vital Signs BP 97/60 (BP Location: Right Arm)    Pulse 69    Temp 98 F (36.7 C)    Resp 20    Ht 4\' 11"  (1.499 m)    LMP 07/19/2014 (Approximate)    SpO2 100%    BMI 26.05 kg/m  Physical Exam Vitals and nursing note reviewed.  Constitutional:  General: She is not in acute distress. HENT:     Head: Normocephalic and atraumatic.  Eyes:     Conjunctiva/sclera: Conjunctivae normal.     Pupils: Pupils are equal, round, and reactive to light.  Cardiovascular:     Rate and Rhythm: Normal rate and regular rhythm.  Pulmonary:     Effort: Pulmonary effort is normal. No respiratory distress.  Abdominal:     General: There is no distension.     Tenderness: There is generalized abdominal tenderness and tenderness in the right lower quadrant and periumbilical area. There is guarding and rebound.  Musculoskeletal:        General: No deformity or signs of injury.     Cervical back: Neck supple.  Skin:    Findings: No lesion or rash.  Neurological:     General: No  focal deficit present.     Mental Status: She is alert. Mental status is at baseline.    ED Results / Procedures / Treatments   Labs (all labs ordered are listed, but only abnormal results are displayed) Labs Reviewed  URINALYSIS, ROUTINE W REFLEX MICROSCOPIC - Abnormal; Notable for the following components:      Result Value   Hgb urine dipstick TRACE (*)    Protein, ur TRACE (*)    Leukocytes,Ua SMALL (*)    Bacteria, UA RARE (*)    All other components within normal limits  CBC WITH DIFFERENTIAL/PLATELET - Abnormal; Notable for the following components:   Hemoglobin 10.6 (*)    HCT 32.4 (*)    All other components within normal limits  COMPREHENSIVE METABOLIC PANEL - Abnormal; Notable for the following components:   Potassium 3.4 (*)    Calcium 8.4 (*)    AST 13 (*)    All other components within normal limits  CBC - Abnormal; Notable for the following components:   Hemoglobin 11.1 (*)    HCT 33.3 (*)    All other components within normal limits  BASIC METABOLIC PANEL - Abnormal; Notable for the following components:   Glucose, Bld 100 (*)    Calcium 8.4 (*)    All other components within normal limits  VITAMIN B12 - Abnormal; Notable for the following components:   Vitamin B-12 148 (*)    All other components within normal limits  RESP PANEL BY RT-PCR (FLU A&B, COVID) ARPGX2  LIPASE, BLOOD  LACTIC ACID, PLASMA  FOLATE  IRON AND TIBC  SURGICAL PATHOLOGY    EKG None  Radiology CT Abdomen Pelvis W Contrast  Result Date: 09/14/2021 CLINICAL DATA:  Abdominal pain. EXAM: CT ABDOMEN AND PELVIS WITH CONTRAST TECHNIQUE: Multidetector CT imaging of the abdomen and pelvis was performed using the standard protocol following bolus administration of intravenous contrast. RADIATION DOSE REDUCTION: This exam was performed according to the departmental dose-optimization program which includes automated exposure control, adjustment of the mA and/or kV according to patient size  and/or use of iterative reconstruction technique. CONTRAST:  72mL OMNIPAQUE IOHEXOL 300 MG/ML  SOLN COMPARISON:  CT abdomen pelvis dated 02/15/2019. FINDINGS: Lower chest: The visualized lung bases are clear. No intra-abdominal free air.  Trace free fluid within the pelvis. Hepatobiliary: The liver is unremarkable. Mild intrahepatic biliary dilatation, likely post cholecystectomy. No retained calcified stone noted in the central CBD. Pancreas: Unremarkable. No pancreatic ductal dilatation or surrounding inflammatory changes. Spleen: Normal in size without focal abnormality. Adrenals/Urinary Tract: The adrenal glands unremarkable. The kidneys, visualized ureters, and urinary bladder appear unremarkable. Stomach/Bowel: There is no bowel  obstruction. Postsurgical changes of the bowel with anastomotic suture in the region of the rectosigmoid. The appendix is enlarged and fluid filled measuring up to 15 mm in diameter. Several stones noted within the appendix and obstructing the base of the appendix. There is mild inflammatory changes of appendix. The appendix is located in the right hemipelvis posterior to the cecum and superior to the bladder dome. No evidence of perforation or abscess. Vascular/Lymphatic: The abdominal aorta and IVC are unremarkable. No portal venous gas. There is no adenopathy. Reproductive: Hysterectomy. There is a 5.2 x 4.6 cm cystic structure in the region of the right adnexa adjacent to the appendix. Because this lesion is not adequately characterized, prompt [place] is recommended for further evaluation. Note: This recommendation does not apply to premenarchal patients and to those with increased risk (genetic, family history, elevated tumor markers or other high-risk factors) of ovarian cancer. Reference: JACR 2020 Feb; 17(2):248-254 Other: Anterior pelvic wall surgical scar. Musculoskeletal: No acute or significant osseous findings. IMPRESSION: 1. Acute appendicitis. No evidence of perforation or  abscess. 2. A 5.2 x 4.6 cm cystic structure in the region of the right adnexa adjacent to the appendix. Because this lesion is not adequately characterized, prompt [place] is recommended for further evaluation. Electronically Signed   By: Anner Crete M.D.   On: 09/14/2021 21:21    Procedures Procedures    Medications Ordered in ED Medications  lactated ringers infusion ( Intravenous New Bag/Given 09/15/21 0611)  piperacillin-tazobactam (ZOSYN) IVPB 3.375 g ( Intravenous MAR Unhold 09/15/21 1018)  topiramate (TOPAMAX) tablet 200 mg ( Oral MAR Unhold 09/15/21 1018)  hydroxychloroquine (PLAQUENIL) tablet 200 mg ( Oral MAR Unhold 09/15/21 1018)  acetaminophen (TYLENOL) tablet 1,000 mg (has no administration in time range)  ibuprofen (ADVIL) tablet 600 mg (has no administration in time range)  methocarbamol (ROBAXIN) tablet 750 mg (has no administration in time range)  ondansetron (ZOFRAN) injection 4 mg (has no administration in time range)  fentaNYL (SUBLIMAZE) injection 12.5-25 mcg (has no administration in time range)  HYDROmorphone (DILAUDID) 1 MG/ML injection (has no administration in time range)  fentaNYL (SUBLIMAZE) injection 100 mcg (100 mcg Intravenous Given 09/14/21 2052)  ondansetron (ZOFRAN) injection 4 mg (4 mg Intravenous Given 09/14/21 2049)  lactated ringers bolus 1,000 mL (0 mLs Intravenous Stopped 09/14/21 2216)  iohexol (OMNIPAQUE) 300 MG/ML solution 100 mL (75 mLs Intravenous Contrast Given 09/14/21 2100)  piperacillin-tazobactam (ZOSYN) IVPB 3.375 g (0 g Intravenous Stopped 09/14/21 2217)  metoCLOPramide (REGLAN) injection 10 mg (10 mg Intravenous Given 09/14/21 2238)  fentaNYL (SUBLIMAZE) injection 25 mcg (25 mcg Intravenous Given 09/15/21 0255)  potassium chloride 10 mEq in 100 mL IVPB (10 mEq Intravenous New Bag/Given 09/15/21 0258)  chlorhexidine (PERIDEX) 0.12 % solution 15 mL (15 mLs Mouth/Throat Given 09/15/21 0807)    Or  MEDLINE mouth rinse ( Mouth Rinse See Alternative  09/15/21 0807)    ED Course/ Medical Decision Making/ A&P                           Medical Decision Making Amount and/or Complexity of Data Reviewed Labs: ordered. Radiology: ordered.  Risk Prescription drug management. Decision regarding hospitalization.   51 year old female presenting to the emergency department from home with a complaint of abdominal pain.  Patient states that the pain has been present since this past Saturday but worsened today. Pain started in the epigastrium/periumbilical area and has spread to become more diffuse. She now endorses diffuse abdominal  pain and nausea . She denies and fevers or chills. She has a history of IBS. She has a history of diverticulitis and sigmoidectomy. She is not sure when she last passed gas.   On arrival, the patient was vitally stable. NSR and intermittent sinus tachycardia noted on cardiac telemetry. The patient had an exam concerning for developing peritonitis. IV access was obtained and the patient was volume resuscitated with IVF and provided pain control with IV Dilaudid.   With presenting history and physical exam, considered Bowel Obstruction, Pyelonephritis, Nephrolithiasis, Pancreatitis, Perforated Bowel or Ulcer, Diverticulosis/itis, Ischemic Mesentery, Inflammatory Bowel Disease, Strangulated/Incarcerated Hernia, gastritis, PUD, appendicitis.   CT of the Abdomen and Pelvis was concerning for acute appendicitis without abscess or rupture. The patient was administered IV Zosyn and general surgery was consulted. Plan for admission [facility] with a plan to take to the OR tomorrow in the AM given her current stability. Hospitalist medicine consulted for admission.    Final Clinical Impression(s) / ED Diagnoses Final diagnoses:  Acute appendicitis with localized peritonitis, unspecified whether abscess present, unspecified whether gangrene present, unspecified whether perforation present    Rx / DC Orders ED Discharge  Orders     None         Regan Lemming, MD 09/15/21 1213

## 2021-09-14 NOTE — Discharge Instructions (Addendum)
[facility]  Please arrive at least 30 min before your appointment to complete your check in paperwork.  If you are unable to arrive 30 min prior to your appointment time we may have to cancel or reschedule you. LAPAROSCOPIC SURGERY: POST OP INSTRUCTIONS Always review your discharge instruction sheet given to you by the facility where your surgery was performed. IF YOU HAVE DISABILITY OR FAMILY LEAVE FORMS, YOU MUST BRING THEM TO THE OFFICE FOR PROCESSING.   DO NOT GIVE THEM TO YOUR DOCTOR.  PAIN CONTROL  First take acetaminophen (Tylenol) AND/or ibuprofen (Advil) to control your pain after surgery.  Follow directions on package.  Taking acetaminophen (Tylenol) and/or ibuprofen (Advil) regularly after surgery will help to control your pain and lower the amount of prescription pain medication you may need.  You should not take more than 4,000 mg (4 grams) of acetaminophen (Tylenol) in 24 hours.  You should not take ibuprofen (Advil), aleve, motrin, naprosyn or other NSAIDS if you have a history of stomach ulcers or chronic kidney disease.  A prescription for pain medication may be given to you upon discharge.  Take your pain medication as prescribed, if you still have uncontrolled pain after taking acetaminophen (Tylenol) or ibuprofen (Advil). Use ice packs to help control pain. If you need a refill on your pain medication, please contact your pharmacy.  They will contact our office to request authorization. Prescriptions will not be filled after 5pm or on week-ends.  HOME MEDICATIONS Take your usually prescribed medications unless otherwise directed.  DIET You should follow a light diet the first few days after arrival home.  Be sure to include lots of fluids daily. Avoid fatty, fried foods.   CONSTIPATION It is common to experience some constipation after surgery and if you are taking pain medication.  Increasing fluid intake and taking a stool softener (such as Colace)  will usually help or prevent this problem from occurring.  A mild laxative (Milk of Magnesia or Miralax) should be taken according to package instructions if there are no bowel movements after 48 hours.  WOUND/INCISION CARE Most patients will experience some swelling and bruising in the area of the incisions.  Ice packs will help.  Swelling and bruising can take several days to resolve.  Unless discharge instructions indicate otherwise, follow guidelines below  STERI-STRIPS - you may remove your outer bandages 48 hours after surgery, and you may shower at that time.  You have steri-strips (small skin tapes) in place directly over the incision.  These strips should be left on the skin for 7-10 days.   DERMABOND/SKIN GLUE - you may shower in 24 hours.  The glue will flake off over the next 2-3 weeks. Any sutures or staples will be removed at the office during your follow-up visit.  ACTIVITIES You may resume regular (light) daily activities beginning the next day--such as daily self-care, walking, climbing stairs--gradually increasing activities as tolerated.  You may have sexual intercourse when it is comfortable.  Refrain from any heavy lifting or straining until approved by your doctor. You may drive when you are no longer taking prescription pain medication, you can comfortably wear a seatbelt, and you can safely maneuver your car and apply brakes.  FOLLOW-UP You should see your doctor in the office for a follow-up appointment approximately 2-3 weeks after your surgery.  You should have been given your post-op/follow-up appointment when your surgery was scheduled.  If you did not receive a post-op/follow-up appointment, make sure   that you call for this appointment within a day or two after you arrive home to insure a convenient appointment time.   WHEN TO CALL YOUR DOCTOR: Fever over 101.0 Inability to urinate Continued bleeding from incision. Increased pain, redness, or drainage from the  incision. Increasing abdominal pain  The clinic staff is available to answer your questions during regular business hours.  Please don't hesitate to call and ask to speak to one of the nurses for clinical concerns.  If you have a medical emergency, go to the nearest emergency room or call 911.  A surgeon from [facility] is always on call at the hospital. [address], [address], [place], [place]  [address] ? [address] [address], [place], [place]   [address] [phone] ? [phone] ? FAX [phone]  

## 2021-09-14 NOTE — ED Triage Notes (Signed)
Patient here POV from Home with ABD Pain.  Pain has been present since Saturday and initially was Epigastric but is now more generalized throughout ABD.  Moderate Nausea. No Emesis. No Diarrhea. Constipation. No Fevers.  Patient was at [facility] and then ED yesterday but LWBS due to Wait.   NAD Noted during Triage. A&Ox4. GCS 15. Ambulatory.

## 2021-09-15 ENCOUNTER — Encounter (HOSPITAL_COMMUNITY)
Admission: EM | Disposition: A | Discharge status: Home / Self Care | Payer: Self-pay | Source: Home / Self Care | Attending: Emergency Medicine

## 2021-09-15 ENCOUNTER — Inpatient Hospital Stay (HOSPITAL_COMMUNITY): Payer: 59 | Admitting: Certified Registered Nurse Anesthetist

## 2021-09-15 ENCOUNTER — Encounter (HOSPITAL_COMMUNITY): Payer: Self-pay | Admitting: Internal Medicine

## 2021-09-15 DIAGNOSIS — M359 Systemic involvement of connective tissue, unspecified: Secondary | ICD-10-CM | POA: Diagnosis not present

## 2021-09-15 DIAGNOSIS — Z20822 Contact with and (suspected) exposure to covid-19: Secondary | ICD-10-CM | POA: Diagnosis not present

## 2021-09-15 DIAGNOSIS — G43009 Migraine without aura, not intractable, without status migrainosus: Secondary | ICD-10-CM

## 2021-09-15 DIAGNOSIS — K353 Acute appendicitis with localized peritonitis, without perforation or gangrene: Secondary | ICD-10-CM

## 2021-09-15 DIAGNOSIS — M329 Systemic lupus erythematosus, unspecified: Secondary | ICD-10-CM | POA: Diagnosis not present

## 2021-09-15 DIAGNOSIS — Z9049 Acquired absence of other specified parts of digestive tract: Secondary | ICD-10-CM

## 2021-09-15 DIAGNOSIS — E876 Hypokalemia: Secondary | ICD-10-CM

## 2021-09-15 DIAGNOSIS — N9489 Other specified conditions associated with female genital organs and menstrual cycle: Secondary | ICD-10-CM | POA: Diagnosis not present

## 2021-09-15 DIAGNOSIS — D649 Anemia, unspecified: Secondary | ICD-10-CM | POA: Diagnosis present

## 2021-09-15 HISTORY — DX: Acquired absence of other specified parts of digestive tract: Z90.49

## 2021-09-15 HISTORY — PX: LAPAROSCOPIC APPENDECTOMY: SHX408

## 2021-09-15 LAB — IRON AND TIBC
Iron: 36 ug/dL (ref 28–170)
Saturation Ratios: 11 % (ref 10.4–31.8)
TIBC: 322 ug/dL (ref 250–450)
UIBC: 286 ug/dL

## 2021-09-15 LAB — CBC
HCT: 33.3 % — ABNORMAL LOW (ref 36.0–46.0)
Hemoglobin: 11.1 g/dL — ABNORMAL LOW (ref 12.0–15.0)
MCH: 27 pg (ref 26.0–34.0)
MCHC: 33.3 g/dL (ref 30.0–36.0)
MCV: 81 fL (ref 80.0–100.0)
Platelets: 230 10*3/uL (ref 150–400)
RBC: 4.11 MIL/uL (ref 3.87–5.11)
RDW: 14.1 % (ref 11.5–15.5)
WBC: 6.7 10*3/uL (ref 4.0–10.5)
nRBC: 0 % (ref 0.0–0.2)

## 2021-09-15 LAB — BASIC METABOLIC PANEL
Anion gap: 7 (ref 5–15)
BUN: 8 mg/dL (ref 6–20)
CO2: 24 mmol/L (ref 22–32)
Calcium: 8.4 mg/dL — ABNORMAL LOW (ref 8.9–10.3)
Chloride: 105 mmol/L (ref 98–111)
Creatinine, Ser: 0.64 mg/dL (ref 0.44–1.00)
GFR, Estimated: >60 mL/min (ref >60)
Glucose, Bld: 100 mg/dL — ABNORMAL HIGH (ref 70–99)
Potassium: 3.7 mmol/L (ref 3.5–5.1)
Sodium: 136 mmol/L (ref 135–145)

## 2021-09-15 LAB — FOLATE: Folate: 17.3 ng/mL (ref >5.9)

## 2021-09-15 LAB — VITAMIN B12: Vitamin B-12: 148 pg/mL — ABNORMAL LOW (ref 180–914)

## 2021-09-15 LAB — GLUCOSE, CAPILLARY: Glucose-Capillary: 126 mg/dL — ABNORMAL HIGH (ref 70–99)

## 2021-09-15 SURGERY — APPENDECTOMY, LAPAROSCOPIC
Anesthesia: General

## 2021-09-15 MED ORDER — SUCCINYLCHOLINE CHLORIDE 200 MG/10ML IV SOSY
PREFILLED_SYRINGE | INTRAVENOUS | Status: DC | PRN
  Administered 2021-09-15: 120 mg via INTRAVENOUS

## 2021-09-15 MED ORDER — LACTATED RINGERS IV SOLN: INTRAVENOUS | Status: DC | PRN

## 2021-09-15 MED ORDER — IBUPROFEN 600 MG PO TABS: 600.0000 mg | ORAL_TABLET | Freq: Three times a day (TID) | ORAL | Status: AC | PRN

## 2021-09-15 MED ORDER — SUGAMMADEX SODIUM 200 MG/2ML IV SOLN
INTRAVENOUS | Status: DC | PRN
  Administered 2021-09-15: 125 mg via INTRAVENOUS

## 2021-09-15 MED ORDER — FENTANYL CITRATE PF 50 MCG/ML IJ SOSY
12.5000 ug | PREFILLED_SYRINGE | INTRAMUSCULAR | Status: DC | PRN
  Administered 2021-09-15: 25 ug via INTRAVENOUS
  Filled 2021-09-15 (×2): qty 1

## 2021-09-15 MED ORDER — BUPIVACAINE HCL 0.25 % IJ SOLN
INTRAMUSCULAR | Status: DC | PRN
  Administered 2021-09-15: 5 mL

## 2021-09-15 MED ORDER — PIPERACILLIN-TAZOBACTAM 3.375 G IVPB
3.3750 g | Freq: Three times a day (TID) | INTRAVENOUS | Status: DC
  Administered 2021-09-15 (×2): 3.375 g via INTRAVENOUS
  Filled 2021-09-15 (×2): qty 50

## 2021-09-15 MED ORDER — HYDROXYCHLOROQUINE SULFATE 200 MG PO TABS: 200.0000 mg | ORAL_TABLET | Freq: Every day | ORAL | Status: DC

## 2021-09-15 MED ORDER — TRAMADOL HCL 50 MG PO TABS: 50.0000 mg | ORAL_TABLET | Freq: Four times a day (QID) | ORAL | 0 refills | Status: AC | PRN

## 2021-09-15 MED ORDER — AMISULPRIDE (ANTIEMETIC) 5 MG/2ML IV SOLN: 10.0000 mg | Freq: Once | INTRAVENOUS | Status: DC | PRN

## 2021-09-15 MED ORDER — MEPERIDINE HCL 25 MG/ML IJ SOLN: 6.2500 mg | INTRAMUSCULAR | Status: DC | PRN

## 2021-09-15 MED ORDER — MIDAZOLAM HCL 2 MG/2ML IJ SOLN
INTRAMUSCULAR | Status: AC
  Filled 2021-09-15: qty 2

## 2021-09-15 MED ORDER — PROPOFOL 10 MG/ML IV BOLUS
INTRAVENOUS | Status: DC | PRN
  Administered 2021-09-15: 150 mg via INTRAVENOUS

## 2021-09-15 MED ORDER — LIDOCAINE 2% (20 MG/ML) 5 ML SYRINGE
INTRAMUSCULAR | Status: DC | PRN
  Administered 2021-09-15: 100 mg via INTRAVENOUS

## 2021-09-15 MED ORDER — FENTANYL CITRATE PF 50 MCG/ML IJ SOSY
25.0000 ug | PREFILLED_SYRINGE | Freq: Once | INTRAMUSCULAR | Status: AC
Start: 2021-09-15 — End: 2021-09-15
  Administered 2021-09-15: 25 ug via INTRAVENOUS
  Filled 2021-09-15: qty 1

## 2021-09-15 MED ORDER — PROMETHAZINE HCL 25 MG/ML IJ SOLN: 6.2500 mg | INTRAMUSCULAR | Status: DC | PRN

## 2021-09-15 MED ORDER — LACTATED RINGERS IV SOLN: INTRAVENOUS | Status: AC

## 2021-09-15 MED ORDER — PROPOFOL 10 MG/ML IV BOLUS
INTRAVENOUS | Status: AC
  Filled 2021-09-15: qty 20

## 2021-09-15 MED ORDER — DEXAMETHASONE SODIUM PHOSPHATE 10 MG/ML IJ SOLN
INTRAMUSCULAR | Status: DC | PRN
  Administered 2021-09-15: 5 mg via INTRAVENOUS

## 2021-09-15 MED ORDER — ACETAMINOPHEN 500 MG PO TABS: 1000.0000 mg | ORAL_TABLET | Freq: Four times a day (QID) | ORAL | Status: AC | PRN

## 2021-09-15 MED ORDER — ORAL CARE MOUTH RINSE: 15.0000 mL | Freq: Once | OROMUCOSAL | Status: AC

## 2021-09-15 MED ORDER — CHLORHEXIDINE GLUCONATE 0.12 % MT SOLN: 15.0000 mL | Freq: Once | OROMUCOSAL | Status: AC

## 2021-09-15 MED ORDER — MIDAZOLAM HCL 5 MG/5ML IJ SOLN
INTRAMUSCULAR | Status: DC | PRN
  Administered 2021-09-15: 2 mg via INTRAVENOUS

## 2021-09-15 MED ORDER — OXYCODONE HCL 5 MG PO TABS: 5.0000 mg | ORAL_TABLET | Freq: Once | ORAL | Status: DC | PRN

## 2021-09-15 MED ORDER — LACTATED RINGERS IV SOLN: INTRAVENOUS | Status: DC

## 2021-09-15 MED ORDER — POTASSIUM CHLORIDE 10 MEQ/100ML IV SOLN
10.0000 meq | Freq: Once | INTRAVENOUS | Status: AC
  Administered 2021-09-15: 10 meq via INTRAVENOUS
  Filled 2021-09-15: qty 100

## 2021-09-15 MED ORDER — ONDANSETRON HCL 4 MG/2ML IJ SOLN
INTRAMUSCULAR | Status: DC | PRN
Start: 2021-09-15 — End: 2021-09-15
  Administered 2021-09-15: 4 mg via INTRAVENOUS

## 2021-09-15 MED ORDER — OXYCODONE HCL 5 MG/5ML PO SOLN: 5.0000 mg | Freq: Once | ORAL | Status: DC | PRN

## 2021-09-15 MED ORDER — BUPIVACAINE-EPINEPHRINE (PF) 0.25% -1:200000 IJ SOLN
INTRAMUSCULAR | Status: AC
  Filled 2021-09-15: qty 30

## 2021-09-15 MED ORDER — FENTANYL CITRATE PF 50 MCG/ML IJ SOSY
25.0000 ug | PREFILLED_SYRINGE | INTRAMUSCULAR | Status: DC | PRN
  Administered 2021-09-15: 25 ug via INTRAVENOUS
  Filled 2021-09-15: qty 1

## 2021-09-15 MED ORDER — BUPIVACAINE HCL (PF) 0.25 % IJ SOLN
INTRAMUSCULAR | Status: AC
  Filled 2021-09-15: qty 30

## 2021-09-15 MED ORDER — CHLORHEXIDINE GLUCONATE 0.12 % MT SOLN
OROMUCOSAL | Status: AC
  Administered 2021-09-15: 15 mL via OROMUCOSAL
  Filled 2021-09-15: qty 15

## 2021-09-15 MED ORDER — ONDANSETRON HCL 4 MG/2ML IJ SOLN
4.0000 mg | Freq: Four times a day (QID) | INTRAMUSCULAR | Status: DC | PRN
  Administered 2021-09-15: 4 mg via INTRAVENOUS
  Filled 2021-09-15: qty 2

## 2021-09-15 MED ORDER — KETOROLAC TROMETHAMINE 30 MG/ML IJ SOLN
INTRAMUSCULAR | Status: DC | PRN
Start: 2021-09-15 — End: 2021-09-15
  Administered 2021-09-15: 15 mg via INTRAVENOUS

## 2021-09-15 MED ORDER — FENTANYL CITRATE (PF) 250 MCG/5ML IJ SOLN
INTRAMUSCULAR | Status: DC | PRN
  Administered 2021-09-15 (×2): 50 ug via INTRAVENOUS

## 2021-09-15 MED ORDER — TRAMADOL HCL 50 MG PO TABS
50.0000 mg | ORAL_TABLET | Freq: Four times a day (QID) | ORAL | Status: DC | PRN
  Administered 2021-09-15: 100 mg via ORAL
  Filled 2021-09-15: qty 2

## 2021-09-15 MED ORDER — 0.9 % SODIUM CHLORIDE (POUR BTL) OPTIME
TOPICAL | Status: DC | PRN
  Administered 2021-09-15: 1000 mL

## 2021-09-15 MED ORDER — FENTANYL CITRATE (PF) 250 MCG/5ML IJ SOLN
INTRAMUSCULAR | Status: AC
  Filled 2021-09-15: qty 5

## 2021-09-15 MED ORDER — IBUPROFEN 200 MG PO TABS
600.0000 mg | ORAL_TABLET | Freq: Three times a day (TID) | ORAL | Status: DC | PRN
  Administered 2021-09-15: 600 mg via ORAL
  Filled 2021-09-15: qty 3

## 2021-09-15 MED ORDER — TOPIRAMATE 25 MG PO TABS: 200.0000 mg | ORAL_TABLET | Freq: Every day | ORAL | Status: DC

## 2021-09-15 MED ORDER — METHOCARBAMOL 750 MG PO TABS
750.0000 mg | ORAL_TABLET | Freq: Four times a day (QID) | ORAL | Status: DC | PRN
  Administered 2021-09-15: 750 mg via ORAL
  Filled 2021-09-15: qty 1

## 2021-09-15 MED ORDER — ACETAMINOPHEN 500 MG PO TABS: 1000.0000 mg | ORAL_TABLET | Freq: Four times a day (QID) | ORAL | Status: DC | PRN

## 2021-09-15 MED ORDER — ROCURONIUM BROMIDE 10 MG/ML (PF) SYRINGE
PREFILLED_SYRINGE | INTRAVENOUS | Status: DC | PRN
Start: 2021-09-15 — End: 2021-09-15
  Administered 2021-09-15: 30 mg via INTRAVENOUS

## 2021-09-15 MED ORDER — HYDROMORPHONE HCL 1 MG/ML IJ SOLN
0.2500 mg | INTRAMUSCULAR | Status: DC | PRN
  Administered 2021-09-15 (×2): 0.25 mg via INTRAVENOUS

## 2021-09-15 MED ORDER — HYDROMORPHONE HCL 1 MG/ML IJ SOLN
INTRAMUSCULAR | Status: AC
  Filled 2021-09-15: qty 1

## 2021-09-15 MED ORDER — KETOROLAC TROMETHAMINE 30 MG/ML IJ SOLN
INTRAMUSCULAR | Status: AC
  Filled 2021-09-15: qty 1

## 2021-09-15 SURGICAL SUPPLY — 45 items
APPLIER CLIP 5 13 M/L LIGAMAX5 (MISCELLANEOUS)
BAG COUNTER SPONGE SURGICOUNT (BAG) ×2 IMPLANT
BLADE CLIPPER SURG (BLADE) IMPLANT
CANISTER SUCT 3000ML PPV (MISCELLANEOUS) ×2 IMPLANT
CHLORAPREP W/TINT 26 (MISCELLANEOUS) ×2 IMPLANT
CLIP APPLIE 5 13 M/L LIGAMAX5 (MISCELLANEOUS) IMPLANT
CLIP LIGATING HEMO LOK XL GOLD (MISCELLANEOUS) ×2 IMPLANT
COVER SURGICAL LIGHT HANDLE (MISCELLANEOUS) ×2 IMPLANT
COVER TRANSDUCER ULTRASND (DRAPES) ×2 IMPLANT
DERMABOND ADVANCED (GAUZE/BANDAGES/DRESSINGS) ×1
DERMABOND ADVANCED .7 DNX12 (GAUZE/BANDAGES/DRESSINGS) ×1 IMPLANT
ELECT REM PT RETURN 9FT ADLT (ELECTROSURGICAL) ×2
ELECTRODE REM PT RTRN 9FT ADLT (ELECTROSURGICAL) ×1 IMPLANT
ENDOLOOP SUT PDS II  0 18 (SUTURE)
ENDOLOOP SUT PDS II 0 18 (SUTURE) IMPLANT
GLOVE SURG SYN 7.5  E (GLOVE) ×1
GLOVE SURG SYN 7.5 E (GLOVE) ×1 IMPLANT
GLOVE SURG SYN 7.5 PF PI (GLOVE) ×1 IMPLANT
GOWN STRL REUS W/ TWL LRG LVL3 (GOWN DISPOSABLE) ×2 IMPLANT
GOWN STRL REUS W/ TWL XL LVL3 (GOWN DISPOSABLE) ×1 IMPLANT
GOWN STRL REUS W/TWL LRG LVL3 (GOWN DISPOSABLE) ×2
GOWN STRL REUS W/TWL XL LVL3 (GOWN DISPOSABLE) ×1
GRASPER SUT TROCAR 14GX15 (MISCELLANEOUS) ×2 IMPLANT
KIT BASIN OR (CUSTOM PROCEDURE TRAY) ×2 IMPLANT
KIT TURNOVER KIT B (KITS) ×2 IMPLANT
NDL INSUFFLATION 14GA 120MM (NEEDLE) ×1 IMPLANT
NEEDLE INSUFFLATION 14GA 120MM (NEEDLE) ×2 IMPLANT
NS IRRIG 1000ML POUR BTL (IV SOLUTION) ×2 IMPLANT
PAD ARMBOARD 7.5X6 YLW CONV (MISCELLANEOUS) ×4 IMPLANT
SCISSORS LAP 5X35 DISP (ENDOMECHANICALS) ×2 IMPLANT
SET IRRIG TUBING LAPAROSCOPIC (IRRIGATION / IRRIGATOR) ×2 IMPLANT
SET TUBE SMOKE EVAC HIGH FLOW (TUBING) ×2 IMPLANT
SLEEVE ENDOPATH XCEL 5M (ENDOMECHANICALS) ×2 IMPLANT
SPECIMEN JAR SMALL (MISCELLANEOUS) ×2 IMPLANT
SUT MNCRL AB 4-0 PS2 18 (SUTURE) ×2 IMPLANT
SUT SILK 3 0 SH 30 (SUTURE) ×1 IMPLANT
TOWEL GREEN STERILE (TOWEL DISPOSABLE) ×2 IMPLANT
TOWEL GREEN STERILE FF (TOWEL DISPOSABLE) ×2 IMPLANT
TRAY FOLEY W/BAG SLVR 16FR (SET/KITS/TRAYS/PACK) ×1
TRAY FOLEY W/BAG SLVR 16FR ST (SET/KITS/TRAYS/PACK) ×1 IMPLANT
TRAY LAPAROSCOPIC MC (CUSTOM PROCEDURE TRAY) ×2 IMPLANT
TROCAR XCEL NON-BLD 11X100MML (ENDOMECHANICALS) ×2 IMPLANT
TROCAR XCEL NON-BLD 5MMX100MML (ENDOMECHANICALS) ×2 IMPLANT
WARMER LAPAROSCOPE (MISCELLANEOUS) ×2 IMPLANT
WATER STERILE IRR 1000ML POUR (IV SOLUTION) ×2 IMPLANT

## 2021-09-15 NOTE — Op Note (Signed)
09/15/2021  9:20 AM  PATIENT:  Rachel Hammond  51 y.o. female  PRE-OPERATIVE DIAGNOSIS:  acute appendicitis  POST-OPERATIVE DIAGNOSIS:  acute appendicitis  PROCEDURE:  Procedure(s): APPENDECTOMY LAPAROSCOPIC (N/A)  SURGEON:  Surgeon(s) and Role:    Axel Filler, MD - Primary  ANESTHESIA:   local and general  EBL:  10 mL   BLOOD ADMINISTERED:none  DRAINS: none   LOCAL MEDICATIONS USED:  BUPIVICAINE   SPECIMEN:  Source of Specimen:  appendix  DISPOSITION OF SPECIMEN:  PATHOLOGY  COUNTS:  YES  TOURNIQUET:  * No tourniquets in log *  DICTATION: .Dragon Dictation  Complications: none  Counts: reported as correct x 2  Findings:  The patient had a acutely inflamed, non-perforated appendix, R ovarian cyst  Specimen: Appendix  Indications for procedure:  The patient is a 52 year old female with a history of periumbilical pain localized in the right lower quadrant patient had a CT scan which revealed signs consistent with acute appendicitis the patient back in for laparoscopic appendectomy.  Details of the procedure:The patient was taken back to the operating room. The patient was placed in supine position with bilateral SCDs in place.  A foley catheter was place. The patient was prepped and draped in the usual sterile fashion.  After appropriate anitbiotics were confirmed, a time-out was confirmed and all facts were verified.    A pneumoperitoneum of 14 mmHg was obtained via a Veress needle technique in the left lower quadrant quadrant.  A 5 mm trocar and 5 mm camera then placed intra-abdominally there is no injury to any intra-abdominal organs a 10 mm infraumbilical port was placed and direct visualization as was a 5 mm port in the suprapubic area.   The appendix was identified and seen to be non-perforated.  The appendix was cleaned down to the appendiceal base. The mesoappendix was then incised and the appendiceal artery was cauterized.  The the appendiceal base was  clean.  A gold hemoclip was placed proximallyx2 and one distally and the appendix was transected between these 2. It was apparent that the base of the appendix got avulsed.  I placed a 3-0 silk figure eight suture to ligate the appendiceal orifice.  A retrieval bag was then placed into the abdomen and the specimen placed in the bag. The appendiceal stump was cauterized. We evacuate the fluid from the pelvis until the effluent was clear.  The appendix and retrieval  bag was then retrieved via the supraumbilical port. #1 Vicryl was used to reapproximate the fascia at the umbilical port site x2. The skin was reapproximated all port sites 3-0 Monocryl subcuticular fashion. The skin was dressed with Dermabond.  The patient had the foley removed. The patient was awakened from general anesthesia was taken to recovery room in stable condition.     PLAN OF CARE: Admit for overnight observation  PATIENT DISPOSITION:  PACU - hemodynamically stable.   Delay start of Pharmacological VTE agent (>24hrs) due to surgical blood loss or risk of bleeding: not applicable

## 2021-09-15 NOTE — Discharge Summary (Signed)
[facility] Discharge Summary   Patient ID: Rachel Hammond MRN: [id number] DOB/AGE: 05/01/1971 51 y.o.  Admit date: 09/14/2021 Discharge date: 09/15/2021  Admitting Diagnosis: Acute appendicitis   Discharge Diagnosis Patient Active Problem List   Diagnosis Date Noted   Adnexal mass 09/15/2021   Anemia 09/15/2021   Hypokalemia 09/15/2021   Connective tissue disease, undifferentiated ([place]) 09/15/2021   Lupus ([place]) 09/15/2021   History of laparoscopic appendectomy 09/15/2021   Chronic neck pain 10/05/2015   Migraine 10/05/2015    Consultants Internal medicine  Imaging: CT Abdomen Pelvis W Contrast  Result Date: 09/14/2021 CLINICAL DATA:  Abdominal pain. EXAM: CT ABDOMEN AND PELVIS WITH CONTRAST TECHNIQUE: Multidetector CT imaging of the abdomen and pelvis was performed using the standard protocol following bolus administration of intravenous contrast. RADIATION DOSE REDUCTION: This exam was performed according to the departmental dose-optimization program which includes automated exposure control, adjustment of the mA and/or kV according to patient size and/or use of iterative reconstruction technique. CONTRAST:  18mL OMNIPAQUE IOHEXOL 300 MG/ML  SOLN COMPARISON:  CT abdomen pelvis dated 02/15/2019. FINDINGS: Lower chest: The visualized lung bases are clear. No intra-abdominal free air.  Trace free fluid within the pelvis. Hepatobiliary: The liver is unremarkable. Mild intrahepatic biliary dilatation, likely post cholecystectomy. No retained calcified stone noted in the central CBD. Pancreas: Unremarkable. No pancreatic ductal dilatation or surrounding inflammatory changes. Spleen: Normal in size without focal abnormality. Adrenals/Urinary Tract: The adrenal glands unremarkable. The kidneys, visualized ureters, and urinary bladder appear unremarkable. Stomach/Bowel: There is no bowel obstruction. Postsurgical changes of the bowel with anastomotic suture in the region of the  rectosigmoid. The appendix is enlarged and fluid filled measuring up to 15 mm in diameter. Several stones noted within the appendix and obstructing the base of the appendix. There is mild inflammatory changes of appendix. The appendix is located in the right hemipelvis posterior to the cecum and superior to the bladder dome. No evidence of perforation or abscess. Vascular/Lymphatic: The abdominal aorta and IVC are unremarkable. No portal venous gas. There is no adenopathy. Reproductive: Hysterectomy. There is a 5.2 x 4.6 cm cystic structure in the region of the right adnexa adjacent to the appendix. Because this lesion is not adequately characterized, prompt [place] is recommended for further evaluation. Note: This recommendation does not apply to premenarchal patients and to those with increased risk (genetic, family history, elevated tumor markers or other high-risk factors) of ovarian cancer. Reference: JACR 2020 Feb; 17(2):248-254 Other: Anterior pelvic wall surgical scar. Musculoskeletal: No acute or significant osseous findings. IMPRESSION: 1. Acute appendicitis. No evidence of perforation or abscess. 2. A 5.2 x 4.6 cm cystic structure in the region of the right adnexa adjacent to the appendix. Because this lesion is not adequately characterized, prompt [place] is recommended for further evaluation. Electronically Signed   By: Anner Crete M.D.   On: 09/14/2021 21:21    Procedures Dr. Ralene Ok (09/15/21) - Laparoscopic Appendectomy  Hospital Course:  Patient is a 51 year old female who presented to ED with abdominal pain.  Workup showed acute appendicitis.  Patient was admitted and underwent procedure listed above.  Tolerated procedure well and was transferred to the floor.  Diet was advanced as tolerated.  On POD0, the patient was voiding well, tolerating diet, ambulating well, pain well controlled, vital signs stable, incisions c/d/i and felt stable for discharge home.  Patient will follow up in our  office in 3-4 weeks and knows to call with questions or concerns. On CT imaging  she was incidentally found to have right adnexal cystic structure near appendix with further imaging with ultrasound recommended. Findings discussed with patient and she will call to follow up with her OBGYN. We have also provided [facility] information should she choose to follow up elsewhere.  Physical Exam: General:  Alert, NAD, pleasant, comfortable Abd:  Soft, ND, mild tenderness, incisions C/D/I  I or a member of my team have reviewed this patient in the Controlled Substance Database.   Allergies as of 09/15/2021       Reactions   Codeine Anaphylaxis, Swelling        Medication List     STOP taking these medications    HYDROcodone-acetaminophen 5-325 MG tablet Commonly known as: NORCO/VICODIN       TAKE these medications    acetaminophen 500 MG tablet Commonly known as: TYLENOL Take 2 tablets (1,000 mg total) by mouth every 6 (six) hours as needed for mild pain or fever.   COLACE 2-IN-1 PO Take 1 capsule by mouth as needed (constipation).   cyclobenzaprine 10 MG tablet Commonly known as: FLEXERIL Take 10 mg by mouth 3 (three) times daily as needed for muscle spasms.   DULoxetine 60 MG capsule Commonly known as: CYMBALTA Take 60 mg by mouth 2 (two) times daily.   ergocalciferol 1.25 MG (50000 UT) capsule Commonly known as: VITAMIN D2 Take 50,000 Units by mouth once a week. Wednesday   fluticasone 50 MCG/ACT nasal spray Commonly known as: FLONASE Place 1 spray into both nostrils daily.   gabapentin 400 MG capsule Commonly known as: NEURONTIN Take 400 mg by mouth 3 (three) times daily.   ibuprofen 600 MG tablet Commonly known as: ADVIL Take 1 tablet (600 mg total) by mouth every 8 (eight) hours as needed for fever, mild pain or moderate pain. What changed:  medication strength how much to take when to take this reasons to take this   ketorolac 10 MG  tablet Commonly known as: TORADOL Take 10 mg by mouth every 6 (six) hours as needed. Take one tab four times daily x 5 days.   linaclotide 290 MCG Caps capsule Commonly known as: LINZESS Take 290 mcg by mouth daily.   LUMIFY OP Place 1 drop into both eyes as needed (dryness, itchy).   metoCLOPramide 10 MG tablet Commonly known as: REGLAN 1 every 12 hours as needed for headache   nortriptyline 25 MG capsule Commonly known as: Pamelor One po qhs xone week, then 2 tabs po qhs   phentermine 37.5 MG tablet Commonly known as: ADIPEX-P Take 37.5 mg by mouth daily.   PRESCRIPTION MEDICATION Take 1 capsule by mouth 2 (two) times a week. For IBS   SUMAtriptan 50 MG tablet Commonly known as: Imitrex Take 1 tablet (50 mg total) by mouth every 2 (two) hours as needed for migraine. May repeat in 2 hours if headache persists or recurs.   topiramate 100 MG tablet Commonly known as: TOPAMAX Take 100 mg by mouth daily.   traMADol 50 MG tablet Commonly known as: ULTRAM Take 1 tablet (50 mg total) by mouth every 6 (six) hours as needed for up to 5 days for moderate pain or severe pain.          Follow-up [facility], [place]. Call on 10/07/2021.   Specialty: General Surgery Why: 10/07/21 at 3:45. Please bring a copy of your photo ID and insurance card. Please arrive 30 minutes prior to your appointment for  paperwork. Contact information: [address] [place] [phone]        [facility] Follow up.   Specialty: Internal Medicine Why: Please call to arrange follow up with your gynecologist, find one in your area or call this number to arrange follow up. Your CT showed a 5.2 x 4.6 cm cystic structure in the region of the right adnexa. It was recommended by the radiologist to get a follow up ultrasound to further evaluate this. Contact information: [address], [place] [phone]                Signed: Caroll Rancher [facility] 09/15/2021, 3:56 PM Please see Amion for pager number during day hours 7:00am-4:30pm

## 2021-09-15 NOTE — Anesthesia Procedure Notes (Signed)
Procedure Name: Intubation Date/Time: 09/15/2021 8:39 AM Performed by: Colin Benton, CRNA Pre-anesthesia Checklist: Patient identified, Emergency Drugs available, Suction available and Patient being monitored Patient Re-evaluated:Patient Re-evaluated prior to induction Oxygen Delivery Method: Circle system utilized Preoxygenation: Pre-oxygenation with 100% oxygen Induction Type: IV induction, Rapid sequence and Cricoid Pressure applied Laryngoscope Size: Mac and 3 Grade View: Grade I Tube type: Oral Tube size: 7.0 mm Number of attempts: 1 Airway Equipment and Method: Stylet Placement Confirmation: ETT inserted through vocal cords under direct vision, positive ETCO2 and breath sounds checked- equal and bilateral Secured at: 20 cm Tube secured with: Tape Dental Injury: Teeth and Oropharynx as per pre-operative assessment

## 2021-09-15 NOTE — Progress Notes (Signed)
Mobility Specialist Criteria Algorithm Info.   09/15/21 1630  Pain Assessment  Pain Assessment 0-10  Pain Score 9  Pain Location Abdomen  Pain Descriptors / Indicators Discomfort;Aching  Pain Intervention(s) Premedicated before session;Monitored during session  Mobility  Bed Position Semi-fowlers  Activity Ambulated independently in hallway  Range of Motion/Exercises Active;All extremities  Level of Assistance Independent  Assistive Device None  Distance Ambulated (ft) 550 ft  Activity Response Tolerated well   Patient received in bed. Prior to and after ambulation pt complained of 9/10 pain, as well as nausea. Despite pain and nausea agreeable to participate. Ambulated in hallway independently with steady gait. Tolerated well without incident. Was left lying supine in bed with all needs met. RN present.   09/15/2021 4:50 PM  Martinique Steadman [place], [place], [place]  LXBWI:[phone] Office: [phone]

## 2021-09-15 NOTE — Consult Note (Signed)
Reason for Consult: Abdominal pain  Referring Physician: Dr. Duayne Cal is an 51 y.o. female.  HPI: Patient is a 51 year old female who came in with a 3-day history of abdominal pain that she states began in the epigastrium.  Patient states that she has had ongoing pain over the last several days.  She states that it did settle more to her lower abdomen.  She states that she does have IBS and took some stool softeners without any comfort.  She states that she had been otherwise having no fever.  Patient was taken to an outside ER secondary to continued pain.  Upon evaluation the ER she underwent CT scan.  CT scan was significant for right ovarian cyst as well as acute appendicitis.  Patient was transferred to [facility] for further evaluation.  Of note patient's had a previous laparoscopic assisted sigmoidectomy secondary to diverticulitis, lap chole, and cesarean section x2.  I did review the patient's CT scan laboratory studies personally.  Past Medical History:  Diagnosis Date   Anemia    Connective tissue disease (HCC)    Hyperlipemia    IBS (irritable bowel syndrome)    Lupus (HCC)    Migraine    Seizure (HCC)    Sickle cell trait ([place])     Past Surgical History:  Procedure Laterality Date   ABDOMINAL HYSTERECTOMY     ABDOMINAL HYSTERECTOMY     ABDOMINAL SURGERY     CESAREAN SECTION  1993 and 1998   x 2   CHOLECYSTECTOMY  08/29/1989   SMALL INTESTINE SURGERY      Family History  Problem Relation Age of Onset   Diabetes Mother        gangrene   Cancer Father        brain   Heart failure Mother    Stroke Mother    Parkinson's disease Paternal Grandmother     Social History:  reports that she has never smoked. She does not have any smokeless tobacco history on file. She reports that she does not drink alcohol and does not use drugs.  Allergies:  Allergies  Allergen Reactions   Codeine Anaphylaxis and Swelling    Medications: I have reviewed  the patient's current medications.  Results for orders placed or performed during the hospital encounter of 09/14/21 (from the past 48 hour(s))  Urinalysis, Routine w reflex microscopic Urine, Clean Catch     Status: Abnormal   Collection Time: 09/14/21  7:28 PM  Result Value Ref Range   Color, Urine YELLOW YELLOW   APPearance CLEAR CLEAR   Specific Gravity, Urine 1.022 1.005 - 1.030   pH 5.5 5.0 - 8.0   Glucose, UA NEGATIVE NEGATIVE mg/dL   Hgb urine dipstick TRACE (A) NEGATIVE   Bilirubin Urine NEGATIVE NEGATIVE   Ketones, ur NEGATIVE NEGATIVE mg/dL   Protein, ur TRACE (A) NEGATIVE mg/dL   Nitrite NEGATIVE NEGATIVE   Leukocytes,Ua SMALL (A) NEGATIVE   RBC / HPF 0-5 0 - 5 RBC/hpf   WBC, UA 0-5 0 - 5 WBC/hpf   Bacteria, UA RARE (A) NONE SEEN   Squamous Epithelial / LPF 0-5 0 - 5   Mucus PRESENT     Comment: Performed at [facility], [address], [place], [place] [address]  CBC with Differential     Status: Abnormal   Collection Time: 09/14/21  8:35 PM  Result Value Ref Range   WBC 6.8 4.0 - 10.5 K/uL   RBC 4.02 3.87 -  5.11 MIL/uL   Hemoglobin 10.6 (L) 12.0 - 15.0 g/dL   HCT 32.4 (L) 36.0 - 46.0 %   MCV 80.6 80.0 - 100.0 fL   MCH 26.4 26.0 - 34.0 pg   MCHC 32.7 30.0 - 36.0 g/dL   RDW 14.0 11.5 - 15.5 %   Platelets 231 150 - 400 K/uL   nRBC 0.0 0.0 - 0.2 %   Neutrophils Relative % 57 %   Neutro Abs 3.8 1.7 - 7.7 K/uL   Lymphocytes Relative 32 %   Lymphs Abs 2.1 0.7 - 4.0 K/uL   Monocytes Relative 9 %   Monocytes Absolute 0.6 0.1 - 1.0 K/uL   Eosinophils Relative 2 %   Eosinophils Absolute 0.1 0.0 - 0.5 K/uL   Basophils Relative 0 %   Basophils Absolute 0.0 0.0 - 0.1 K/uL   Immature Granulocytes 0 %   Abs Immature Granulocytes 0.02 0.00 - 0.07 K/uL    Comment: Performed at [facility], [place], [place] [address]  Comprehensive metabolic panel     Status: Abnormal   Collection Time: 09/14/21  8:35 PM  Result  Value Ref Range   Sodium 138 135 - 145 mmol/L   Potassium 3.4 (L) 3.5 - 5.1 mmol/L   Chloride 104 98 - 111 mmol/L   CO2 25 22 - 32 mmol/L   Glucose, Bld 83 70 - 99 mg/dL    Comment: Glucose reference range applies only to samples taken after fasting for at least 8 hours.   BUN 11 6 - 20 mg/dL   Creatinine, Ser 0.63 0.44 - 1.00 mg/dL   Calcium 8.4 (L) 8.9 - 10.3 mg/dL   Total Protein 6.7 6.5 - 8.1 g/dL   Albumin 3.7 3.5 - 5.0 g/dL   AST 13 (L) 15 - 41 U/L   ALT 8 0 - 44 U/L   Alkaline Phosphatase 70 38 - 126 U/L   Total Bilirubin 0.3 0.3 - 1.2 mg/dL   GFR, Estimated >60 >60 mL/min    Comment: (NOTE) Calculated using the CKD-EPI Creatinine Equation (2021)    Anion gap 9 5 - 15    Comment: Performed at [facility], [place], [place] [address]  Lipase, blood     Status: None   Collection Time: 09/14/21  8:35 PM  Result Value Ref Range   Lipase 28 11 - 51 U/L    Comment: Performed at [facility], [address], [place], [place] [address]  Lactic acid, plasma     Status: None   Collection Time: 09/14/21  8:36 PM  Result Value Ref Range   Lactic Acid, Venous 0.6 0.5 - 1.9 mmol/L    Comment: Performed at [facility], [address], [place], [place] [address]  Resp Panel by RT-PCR (Flu A&B, Covid) Nasopharyngeal Swab     Status: None   Collection Time: 09/14/21  9:57 PM   Specimen: Nasopharyngeal Swab; Nasopharyngeal(NP) swabs in vial transport medium  Result Value Ref Range   SARS Coronavirus 2 by RT PCR NEGATIVE NEGATIVE    Comment: (NOTE) SARS-CoV-2 target nucleic acids are NOT DETECTED.  The SARS-CoV-2 RNA is generally detectable in upper respiratory specimens during the acute phase of infection. The lowest concentration of SARS-CoV-2 viral copies this assay can detect is 138 copies/mL. A negative result does not preclude SARS-Cov-2 infection and should not be used as the sole basis for treatment  or other patient management decisions. A negative result may  occur with  improper specimen collection/handling, submission of specimen other than nasopharyngeal swab, presence of viral mutation(s) within the areas targeted by this assay, and inadequate number of viral copies(<138 copies/mL). A negative result must be combined with clinical observations, patient history, and epidemiological information. The expected result is Negative.  Fact Sheet for Patients:  EntrepreneurPulse.com.au  Fact Sheet for Healthcare Providers:  IncredibleEmployment.be  This test is no t yet approved or cleared by the [place] FDA and  has been authorized for detection and/or diagnosis of SARS-CoV-2 by FDA under an Emergency Use Authorization (EUA). This EUA will remain  in effect (meaning this test can be used) for the duration of the COVID-19 declaration under Section 564(b)(1) of the Act, 21 U.S.C.section 360bbb-3(b)(1), unless the authorization is terminated  or revoked sooner.       Influenza A by PCR NEGATIVE NEGATIVE   Influenza B by PCR NEGATIVE NEGATIVE    Comment: (NOTE) The Xpert Xpress SARS-CoV-2/FLU/RSV plus assay is intended as an aid in the diagnosis of influenza from Nasopharyngeal swab specimens and should not be used as a sole basis for treatment. Nasal washings and aspirates are unacceptable for Xpert Xpress SARS-CoV-2/FLU/RSV testing.  Fact Sheet for Patients: EntrepreneurPulse.com.au  Fact Sheet for Healthcare Providers: IncredibleEmployment.be  This test is not yet approved or cleared by the [place] FDA and has been authorized for detection and/or diagnosis of SARS-CoV-2 by FDA under an Emergency Use Authorization (EUA). This EUA will remain in effect (meaning this test can be used) for the duration of the COVID-19 declaration under Section 564(b)(1) of the Act, 21 U.S.C. section  360bbb-3(b)(1), unless the authorization is terminated or revoked.  Performed at [facility], [address] [place], [place] [address]   CBC     Status: Abnormal   Collection Time: 09/15/21  2:09 AM  Result Value Ref Range   WBC 6.7 4.0 - 10.5 K/uL   RBC 4.11 3.87 - 5.11 MIL/uL   Hemoglobin 11.1 (L) 12.0 - 15.0 g/dL   HCT 33.3 (L) 36.0 - 46.0 %   MCV 81.0 80.0 - 100.0 fL   MCH 27.0 26.0 - 34.0 pg   MCHC 33.3 30.0 - 36.0 g/dL   RDW 14.1 11.5 - 15.5 %   Platelets 230 150 - 400 K/uL   nRBC 0.0 0.0 - 0.2 %    Comment: Performed at [facility], [place] [address], [place], [place] [id number]  Basic metabolic panel     Status: Abnormal   Collection Time: 09/15/21  2:09 AM  Result Value Ref Range   Sodium 136 135 - 145 mmol/L   Potassium 3.7 3.5 - 5.1 mmol/L   Chloride 105 98 - 111 mmol/L   CO2 24 22 - 32 mmol/L   Glucose, Bld 100 (H) 70 - 99 mg/dL    Comment: Glucose reference range applies only to samples taken after fasting for at least 8 hours.   BUN 8 6 - 20 mg/dL   Creatinine, Ser 0.64 0.44 - 1.00 mg/dL   Calcium 8.4 (L) 8.9 - 10.3 mg/dL   GFR, Estimated >60 >60 mL/min    Comment: (NOTE) Calculated using the CKD-EPI Creatinine Equation (2021)    Anion gap 7 5 - 15    Comment: Performed at [facility] [address] [place], [place] [address]    CT Abdomen Pelvis W Contrast  Result Date: 09/14/2021 CLINICAL DATA:  Abdominal pain. EXAM: CT ABDOMEN AND PELVIS WITH CONTRAST TECHNIQUE:  Multidetector CT imaging of the abdomen and pelvis was performed using the standard protocol following bolus administration of intravenous contrast. RADIATION DOSE REDUCTION: This exam was performed according to the departmental dose-optimization program which includes automated exposure control, adjustment of the mA and/or kV according to patient size and/or use of iterative reconstruction technique. CONTRAST:  30mL OMNIPAQUE IOHEXOL 300 MG/ML  SOLN COMPARISON:  CT  abdomen pelvis dated 02/15/2019. FINDINGS: Lower chest: The visualized lung bases are clear. No intra-abdominal free air.  Trace free fluid within the pelvis. Hepatobiliary: The liver is unremarkable. Mild intrahepatic biliary dilatation, likely post cholecystectomy. No retained calcified stone noted in the central CBD. Pancreas: Unremarkable. No pancreatic ductal dilatation or surrounding inflammatory changes. Spleen: Normal in size without focal abnormality. Adrenals/Urinary Tract: The adrenal glands unremarkable. The kidneys, visualized ureters, and urinary bladder appear unremarkable. Stomach/Bowel: There is no bowel obstruction. Postsurgical changes of the bowel with anastomotic suture in the region of the rectosigmoid. The appendix is enlarged and fluid filled measuring up to 15 mm in diameter. Several stones noted within the appendix and obstructing the base of the appendix. There is mild inflammatory changes of appendix. The appendix is located in the right hemipelvis posterior to the cecum and superior to the bladder dome. No evidence of perforation or abscess. Vascular/Lymphatic: The abdominal aorta and IVC are unremarkable. No portal venous gas. There is no adenopathy. Reproductive: Hysterectomy. There is a 5.2 x 4.6 cm cystic structure in the region of the right adnexa adjacent to the appendix. Because this lesion is not adequately characterized, prompt [place] is recommended for further evaluation. Note: This recommendation does not apply to premenarchal patients and to those with increased risk (genetic, family history, elevated tumor markers or other high-risk factors) of ovarian cancer. Reference: JACR 2020 Feb; 17(2):248-254 Other: Anterior pelvic wall surgical scar. Musculoskeletal: No acute or significant osseous findings. IMPRESSION: 1. Acute appendicitis. No evidence of perforation or abscess. 2. A 5.2 x 4.6 cm cystic structure in the region of the right adnexa adjacent to the appendix. Because this  lesion is not adequately characterized, prompt [place] is recommended for further evaluation. Electronically Signed   By: Elgie Collard M.D.   On: 09/14/2021 21:21    Review of Systems  Constitutional:  Negative for chills and fever.  HENT:  Negative for ear discharge, hearing loss and sore throat.   Eyes:  Negative for discharge.  Respiratory:  Negative for cough and shortness of breath.   Cardiovascular:  Negative for chest pain and leg swelling.  Gastrointestinal:  Positive for abdominal pain. Negative for constipation, diarrhea, nausea and vomiting.  Musculoskeletal:  Negative for myalgias and neck pain.  Skin:  Negative for rash.  Allergic/Immunologic: Negative for environmental allergies.  Neurological:  Negative for dizziness and seizures.  Hematological:  Does not bruise/bleed easily.  Psychiatric/Behavioral:  Negative for suicidal ideas.   All other systems reviewed and are negative. Blood pressure 93/67, pulse 71, temperature 97.8 F (36.6 C), temperature source Oral, resp. rate 20, height 4\' 11"  (1.499 m), last menstrual period 07/19/2014, SpO2 100 %. Physical Exam Constitutional:      Appearance: She is well-developed.     Comments: Conversant No acute distress  HENT:     Head: Normocephalic and atraumatic.  Eyes:     General: Lids are normal. No scleral icterus.    Pupils: Pupils are equal, round, and reactive to light.     Comments: Pupils are equal round and reactive No lid lag Moist conjunctiva  Neck:  Thyroid: No thyromegaly.     Trachea: No tracheal tenderness.     Comments: No cervical lymphadenopathy Cardiovascular:     Rate and Rhythm: Normal rate and regular rhythm.     Heart sounds: No murmur heard. Pulmonary:     Effort: Pulmonary effort is normal.     Breath sounds: Normal breath sounds. No wheezing or rales.  Abdominal:     Tenderness: There is abdominal tenderness in the right lower quadrant and left lower quadrant. There is guarding. Negative  signs include McBurney's sign and psoas sign.     Hernia: No hernia is present.  Musculoskeletal:     Cervical back: Normal range of motion and neck supple.  Skin:    General: Skin is warm.     Findings: No rash.     Nails: There is no clubbing.     Comments: Normal skin turgor  Neurological:     Mental Status: She is alert and oriented to person, place, and time.     Comments: Normal gait and station  Psychiatric:        Mood and Affect: Mood normal.        Thought Content: Thought content normal.        Judgment: Judgment normal.     Comments: Appropriate affect    Assessment/Plan: 51 year old female with acute appendicitis History of lupus IBS Connective tissue disorder Sickle cell trait  1.  We will proceed to the operating for a lap appendectomy.  I discussed with the patient the risks and benefits of the procedure to include but not limited to: Infection, bleeding, damage surrounding structures, possible ileus.  Patient voiced understanding and wishes to proceed.  Ralene Ok 09/15/2021, 7:33 AM

## 2021-09-15 NOTE — Anesthesia Postprocedure Evaluation (Signed)
Anesthesia Post Note  Patient: Rachel Hammond  Procedure(s) Performed: APPENDECTOMY LAPAROSCOPIC     Patient location during evaluation: PACU Anesthesia Type: General Level of consciousness: awake and alert Pain management: pain level controlled Vital Signs Assessment: post-procedure vital signs reviewed and stable Respiratory status: spontaneous breathing, nonlabored ventilation and respiratory function stable Cardiovascular status: blood pressure returned to baseline and stable Postop Assessment: no apparent nausea or vomiting Anesthetic complications: no   No notable events documented.  Last Vitals:  Vitals:   09/15/21 1013 09/15/21 1015  BP: 97/60 97/60  Pulse: 69 69  Resp: 20 20  Temp: 36.7 C 36.7 C  SpO2: 100% 100%    Last Pain:  Vitals:   09/15/21 1015  TempSrc:   PainSc: Asleep                 Lowella Curb

## 2021-09-15 NOTE — H&P (Signed)
History and Physical    Rachel Hammond [id number] DOB: Jul 27, 1971 DOA: 09/14/2021  PCP: Benito Mccreedy, MD  Patient coming from:   I have personally briefly reviewed patient's old medical records in [place]  Chief Complaint: [facility]  HPI: Rachel Hammond is a 51 y.o. 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  Patient reports that about 4 days ago she developed acute rib pain that radiated throughout her abdomen into her left side and also to the back.  Initially thought it was gas and took OTC medication without relief.  Also had nausea and vomiting today.  She then presented to urgent care and was advised to come to the ED.  She has history of extensive abdominal surgeries including C-section, cholecystectomy, total laparoscopic hysterectomy and bilateral salpingectomy and sigmoid colectomy due to complicated diverticulitis.  ED Course: She was afebrile, normotensive on room air. No leukocytosis.  Hemoglobin 10.6.  Sodium 138, K of 3.4, creatinine of 0.63.  Normal LFTs.  Normal lipase.    CT abdomen and pelvis shows acute pancreatitis without perforation or abscess.  There is a cystic structure in the region of the right adnexa adjacent to the appendix with ultrasound recommended.  She was administered fentanyl, Zofran and and 1 L of isotonic fluids.  ER physician discussed case with Dr. Redmond Pulling of general surgery who recommended that medicine admit and for patient be kept n.p.o. after midnight for potential surgical intervention.   Review of Systems: Constitutional: No Fever Cardiovascular: No Chest Pain, no SOB Respiratory: No Cough  Gastrointestinal: + Nausea, + Vomiting, No Diarrhea, + Constipation,+ Pain Genitourinary:  No dysuria  Musculoskeletal: No Arthralgias, No Myalgias Skin: No Skin Lesions, No Pruritus, Neuro: no Weakness,  No Numbness Psych:  no decrease appetite Heme/Lymph: No Bruising, No Bleeding  Past Medical History:  Diagnosis Date   Anemia    Connective tissue disease (HCC)    Hyperlipemia    IBS (irritable bowel syndrome)    Lupus (HCC)    Migraine    Seizure (HCC)    Sickle cell trait ([place])     Past Surgical History:  Procedure Laterality Date   ABDOMINAL HYSTERECTOMY     ABDOMINAL HYSTERECTOMY     ABDOMINAL SURGERY     CESAREAN SECTION  1993 and 1998   x 2   CHOLECYSTECTOMY  08/29/1989   SMALL INTESTINE SURGERY       reports that she has never smoked. She does not have any smokeless tobacco history on file. She reports that she does not drink alcohol and does not use drugs. Social History  Allergies  Allergen Reactions   Codeine Anaphylaxis and Swelling    Family History  Problem Relation Age of Onset   Diabetes Mother        gangrene   Cancer Father        brain   Heart failure Mother    Stroke Mother    Parkinson's disease Paternal Grandmother      Prior to Admission medications   Medication Sig Start Date End Date Taking? Authorizing Provider  cyclobenzaprine (FLEXERIL) 10 MG tablet Take 10 mg by mouth 3 (three) times daily as needed for muscle spasms.    [provider]  DULoxetine (CYMBALTA) 60 MG capsule Take 60 mg by mouth 2 (two) times daily.    [provider]  ergocalciferol (VITAMIN D2) 50000 UNITS capsule Take 50,000 Units  by mouth once a week. Wednesday    [provider]  fluticasone (FLONASE) 50 MCG/ACT nasal spray Place 1 spray into both nostrils daily.    [provider]  HYDROcodone-acetaminophen (NORCO/VICODIN) 5-325 MG per tablet Take 1-2 tablets every 6 hours as needed for severe pain Patient taking differently: Take 1 tablet by mouth 3 (three) times daily.  11/17/13   Carlisle Cater, PA-C  ibuprofen (ADVIL,MOTRIN) 800 MG tablet Take 800 mg by mouth 3 (three) times daily.    [provider]  ketorolac  (TORADOL) 10 MG tablet Take 10 mg by mouth every 6 (six) hours as needed. Take one tab four times daily x 5 days.    [provider]  Linaclotide (LINZESS) 290 MCG CAPS capsule Take 290 mcg by mouth daily.    [provider]  metoCLOPramide (REGLAN) 10 MG tablet 1 every 12 hours as needed for headache 05/05/14   Harden Mo, MD  nortriptyline (PAMELOR) 25 MG capsule One po qhs xone week, then 2 tabs po qhs 10/05/15   Marcial Pacas, MD  phentermine (ADIPEX-P) 37.5 MG tablet Take 37.5 mg by mouth daily.    [provider]  PRESCRIPTION MEDICATION Take 1 capsule by mouth 2 (two) times a week. For IBS    [provider]  SUMAtriptan (IMITREX) 50 MG tablet Take 1 tablet (50 mg total) by mouth every 2 (two) hours as needed for migraine. May repeat in 2 hours if headache persists or recurs. 10/05/15   Marcial Pacas, MD  topiramate (TOPAMAX) 100 MG tablet Take 100 mg by mouth daily.     [provider]    Physical Exam: Vitals:   09/14/21 2123 09/14/21 2230 09/14/21 2300 09/14/21 2335  BP:  108/73 102/69 93/67  Pulse: 79 70 81 71  Resp:   18 20  Temp:    97.8 F (36.6 C)  TempSrc:    Oral  SpO2: 95% 98% 100% 100%    Constitutional: NAD, calm, comfortable, ill-appearing middle-aged female lying in bed Vitals:   09/14/21 2123 09/14/21 2230 09/14/21 2300 09/14/21 2335  BP:  108/73 102/69 93/67  Pulse: 79 70 81 71  Resp:   18 20  Temp:    97.8 F (36.6 C)  TempSrc:    Oral  SpO2: 95% 98% 100% 100%   Eyes: lids and conjunctivae normal ENMT: Mucous membranes are moist.  Neck: normal, supple,  Respiratory: clear to auscultation bilaterally, no wheezing, no crackles. Normal respiratory effort. No accessory muscle use.  Cardiovascular: Regular rate and rhythm, no murmurs / rubs / gallops. No extremity edema.   Abdomen: Soft, nondistended diffusely moderately tender abdomen without any guarding, rigidity or rebound tenderness.  No mass palpitated.  Bowel  sounds positive in all quadrants. Musculoskeletal: no clubbing / cyanosis. No joint deformity upper and lower extremities. Normal muscle tone.  Skin: no rashes, lesions, ulcers.  Neurologic: CN 2-12 grossly intact. Strength 5/5 in all 4.  Psychiatric: Normal judgment and insight. Alert and oriented x 3. Normal mood.     Labs on Admission: I have personally reviewed following labs and imaging studies  CBC: Recent Labs  Lab 09/13/21 1541 09/14/21 2035  WBC 8.7 6.8  NEUTROABS  --  3.8  HGB 13.4 10.6*  HCT 40.6 32.4*  MCV 81.7 80.6  PLT 264 [id number]   Basic Metabolic Panel: Recent Labs  Lab 09/13/21 1541 09/14/21 2035  NA 134* 138  K 3.5 3.4*  CL 103 104  CO2 23  25  GLUCOSE 90 83  BUN 11 11  CREATININE 0.53 0.63  CALCIUM 8.9 8.4*   GFR: Estimated Creatinine Clearance: 64.7 mL/min (by C-G formula based on SCr of 0.63 mg/dL). Liver Function Tests: Recent Labs  Lab 09/13/21 1541 09/14/21 2035  AST 18 13*  ALT 13 8  ALKPHOS 69 70  BILITOT 0.8 0.3  PROT 8.3* 6.7  ALBUMIN 4.3 3.7   Recent Labs  Lab 09/13/21 1541 09/14/21 2035  LIPASE 28 28   No results for input(s): AMMONIA in the last 168 hours. Coagulation Profile: No results for input(s): INR, PROTIME in the last 168 hours. Cardiac Enzymes: No results for input(s): CKTOTAL, CKMB, CKMBINDEX, TROPONINI in the last 168 hours. BNP (last 3 results) No results for input(s): PROBNP in the last 8760 hours. HbA1C: No results for input(s): HGBA1C in the last 72 hours. CBG: No results for input(s): GLUCAP in the last 168 hours. Lipid Profile: No results for input(s): CHOL, HDL, LDLCALC, TRIG, CHOLHDL, LDLDIRECT in the last 72 hours. Thyroid Function Tests: No results for input(s): TSH, T4TOTAL, FREET4, T3FREE, THYROIDAB in the last 72 hours. Anemia Panel: No results for input(s): VITAMINB12, FOLATE, FERRITIN, TIBC, IRON, RETICCTPCT in the last 72 hours. Urine analysis:    Component Value Date/Time   COLORURINE  YELLOW 09/14/2021 1928   APPEARANCEUR CLEAR 09/14/2021 1928   LABSPEC 1.022 09/14/2021 1928   PHURINE 5.5 09/14/2021 1928   GLUCOSEU NEGATIVE 09/14/2021 1928   HGBUR TRACE (A) 09/14/2021 1928   BILIRUBINUR NEGATIVE 09/14/2021 1928   KETONESUR NEGATIVE 09/14/2021 1928   PROTEINUR TRACE (A) 09/14/2021 1928   UROBILINOGEN 0.2 08/06/2014 1806   NITRITE NEGATIVE 09/14/2021 1928   LEUKOCYTESUR SMALL (A) 09/14/2021 1928    Radiological Exams on Admission: CT Abdomen Pelvis W Contrast  Result Date: 09/14/2021 CLINICAL DATA:  Abdominal pain. EXAM: CT ABDOMEN AND PELVIS WITH CONTRAST TECHNIQUE: Multidetector CT imaging of the abdomen and pelvis was performed using the standard protocol following bolus administration of intravenous contrast. RADIATION DOSE REDUCTION: This exam was performed according to the departmental dose-optimization program which includes automated exposure control, adjustment of the mA and/or kV according to patient size and/or use of iterative reconstruction technique. CONTRAST:  83mL OMNIPAQUE IOHEXOL 300 MG/ML  SOLN COMPARISON:  CT abdomen pelvis dated 02/15/2019. FINDINGS: Lower chest: The visualized lung bases are clear. No intra-abdominal free air.  Trace free fluid within the pelvis. Hepatobiliary: The liver is unremarkable. Mild intrahepatic biliary dilatation, likely post cholecystectomy. No retained calcified stone noted in the central CBD. Pancreas: Unremarkable. No pancreatic ductal dilatation or surrounding inflammatory changes. Spleen: Normal in size without focal abnormality. Adrenals/Urinary Tract: The adrenal glands unremarkable. The kidneys, visualized ureters, and urinary bladder appear unremarkable. Stomach/Bowel: There is no bowel obstruction. Postsurgical changes of the bowel with anastomotic suture in the region of the rectosigmoid. The appendix is enlarged and fluid filled measuring up to 15 mm in diameter. Several stones noted within the appendix and obstructing  the base of the appendix. There is mild inflammatory changes of appendix. The appendix is located in the right hemipelvis posterior to the cecum and superior to the bladder dome. No evidence of perforation or abscess. Vascular/Lymphatic: The abdominal aorta and IVC are unremarkable. No portal venous gas. There is no adenopathy. Reproductive: Hysterectomy. There is a 5.2 x 4.6 cm cystic structure in the region of the right adnexa adjacent to the appendix. Because this lesion is not adequately characterized, prompt [place] is recommended for further evaluation. Note: This recommendation  does not apply to premenarchal patients and to those with increased risk (genetic, family history, elevated tumor markers or other high-risk factors) of ovarian cancer. Reference: JACR 2020 Feb; 17(2):248-254 Other: Anterior pelvic wall surgical scar. Musculoskeletal: No acute or significant osseous findings. IMPRESSION: 1. Acute appendicitis. No evidence of perforation or abscess. 2. A 5.2 x 4.6 cm cystic structure in the region of the right adnexa adjacent to the appendix. Because this lesion is not adequately characterized, prompt [place] is recommended for further evaluation. Electronically Signed   By: Anner Crete M.D.   On: 09/14/2021 21:21      Assessment/Plan  Acute appendicitis No perforation or abscess seen on CT -Continue daily IV Zosyn -PRN Fentanyl for pain -Continuous IV LR fluid overnight while NPO  -Surgery to formally consult in the morning   Right adnexal cystic lesion adjacent to appendix -Obtain pelvic ultrasound to further evaluate  Anemia Hemoglobin 10.6.  FOBT positive suspect secondary to acute appendicitis -Check iron panel, vitamin B12 folate  Mild hypokalemia -replete with IV K 19meq x 1  Lupus/undifferentiated connective tissue disease -No other symptoms to suggest acute flareup -Continue Plaquenil -Patient used to follow with rheumatology at [facility] but has not seen them since 2021  due to new insurance being out of network.  Currently still working to find new rheumatologist.  Migraine Continue daily Topamax  DVT prophylaxis:.SCD Code Status: Full Family Communication: Plan discussed with patient at bedside  disposition Plan: Home with at least 2 midnight stays  Consults called: General surgery Admission status: inpatient  Level of care: Med-Surg  Status is: Inpatient  Remains inpatient appropriate because: Admit - It is my clinical opinion that admission to INPATIENT is reasonable and necessary because this patient will require at least 2 midnights in the hospital to treat this condition based on the medical complexity of the problems presented.  Given the aforementioned information, the predictability of an adverse outcome is felt to be significant.         Orene Desanctis DO [facility]   If 7PM-7AM, please contact night-coverage [email or web address]   09/15/2021, 2:03 AM

## 2021-09-15 NOTE — Progress Notes (Signed)
Pt tolerated dinner, walking the unit and pain is OK managed with oral pain medications. Daughter on the way to pick up pt for discharge. Instructions reviewed and questions answered. All belongings returned to patient. Patient excited to return home.

## 2021-09-15 NOTE — Transfer of Care (Signed)
Immediate Anesthesia Transfer of Care Note  Patient: Rachel Hammond  Procedure(s) Performed: APPENDECTOMY LAPAROSCOPIC  Patient Location: PACU  Anesthesia Type:General  Level of Consciousness: drowsy  Airway & Oxygen Therapy: Patient Spontanous Breathing and Patient connected to nasal cannula oxygen  Post-op Assessment: Report given to RN and Post -op Vital signs reviewed and stable  Post vital signs: Reviewed and stable  Last Vitals:  Vitals Value Taken Time  BP 105/60 09/15/21 0931  Temp    Pulse 69 09/15/21 0932  Resp 21 09/15/21 0932  SpO2 100 % 09/15/21 0932  Vitals shown include unvalidated device data.  Last Pain:  Vitals:   09/15/21 0700  TempSrc:   PainSc: 8          Complications: No notable events documented.

## 2021-09-15 NOTE — Anesthesia Preprocedure Evaluation (Signed)
Anesthesia Evaluation  Patient identified by MRN, date of birth, ID band Patient awake    Reviewed: Allergy & Precautions, NPO status , Patient's Chart, lab work & pertinent test results  Airway Mallampati: II  TM Distance: >3 FB Neck ROM: Full    Dental no notable dental hx.    Pulmonary neg pulmonary ROS,    Pulmonary exam normal breath sounds clear to auscultation       Cardiovascular negative cardio ROS Normal cardiovascular exam Rhythm:Regular Rate:Normal     Neuro/Psych  Headaches, Seizures -,  negative psych ROS   GI/Hepatic negative GI ROS, Neg liver ROS,   Endo/Other  negative endocrine ROS  Renal/GU negative Renal ROS  negative genitourinary   Musculoskeletal negative musculoskeletal ROS (+)   Abdominal   Peds negative pediatric ROS (+)  Hematology negative hematology ROS (+)   Anesthesia Other Findings   Reproductive/Obstetrics negative OB ROS                             Anesthesia Physical Anesthesia Plan  ASA: 3  Anesthesia Plan: General   Post-op Pain Management:    Induction: Intravenous, Rapid sequence and Cricoid pressure planned  PONV Risk Score and Plan: 3 and Ondansetron, Dexamethasone, Midazolam and Treatment may vary due to age or medical condition  Airway Management Planned: Oral ETT  Additional Equipment:   Intra-op Plan:   Post-operative Plan: Extubation in OR  Informed Consent: I have reviewed the patients History and Physical, chart, labs and discussed the procedure including the risks, benefits and alternatives for the proposed anesthesia with the patient or authorized representative who has indicated his/her understanding and acceptance.     Dental advisory given  Plan Discussed with: CRNA  Anesthesia Plan Comments:         Anesthesia Quick Evaluation

## 2021-09-15 NOTE — Progress Notes (Signed)
Report called to Wynonia Musty, CRNA. Pt currently being transported via bed to pre-op.

## 2021-09-16 ENCOUNTER — Encounter (HOSPITAL_COMMUNITY): Payer: Self-pay | Admitting: General Surgery

## 2021-09-16 LAB — SURGICAL PATHOLOGY

## 2021-10-05 ENCOUNTER — Other Ambulatory Visit: Payer: Self-pay | Admitting: Obstetrics and Gynecology

## 2021-10-05 DIAGNOSIS — R102 Pelvic and perineal pain: Secondary | ICD-10-CM

## 2021-10-07 ENCOUNTER — Ambulatory Visit (INDEPENDENT_AMBULATORY_CARE_PROVIDER_SITE_OTHER): Payer: 59

## 2021-10-07 ENCOUNTER — Other Ambulatory Visit: Payer: Self-pay

## 2021-10-07 DIAGNOSIS — R102 Pelvic and perineal pain: Secondary | ICD-10-CM

## 2022-01-17 ENCOUNTER — Other Ambulatory Visit: Payer: Self-pay | Admitting: Physician Assistant

## 2022-01-17 DIAGNOSIS — Z1231 Encounter for screening mammogram for malignant neoplasm of breast: Secondary | ICD-10-CM

## 2022-12-07 DIAGNOSIS — G43909 Migraine, unspecified, not intractable, without status migrainosus: Secondary | ICD-10-CM | POA: Diagnosis not present

## 2022-12-07 DIAGNOSIS — Z131 Encounter for screening for diabetes mellitus: Secondary | ICD-10-CM | POA: Diagnosis not present

## 2022-12-07 DIAGNOSIS — K5909 Other constipation: Secondary | ICD-10-CM | POA: Diagnosis not present

## 2022-12-07 DIAGNOSIS — G8929 Other chronic pain: Secondary | ICD-10-CM | POA: Diagnosis not present

## 2022-12-07 DIAGNOSIS — J302 Other seasonal allergic rhinitis: Secondary | ICD-10-CM | POA: Diagnosis not present

## 2022-12-07 DIAGNOSIS — F339 Major depressive disorder, recurrent, unspecified: Secondary | ICD-10-CM | POA: Diagnosis not present

## 2022-12-08 ENCOUNTER — Other Ambulatory Visit: Payer: Self-pay | Admitting: Internal Medicine

## 2022-12-08 DIAGNOSIS — Z1231 Encounter for screening mammogram for malignant neoplasm of breast: Secondary | ICD-10-CM

## 2023-01-16 DIAGNOSIS — L239 Allergic contact dermatitis, unspecified cause: Secondary | ICD-10-CM | POA: Diagnosis not present

## 2023-01-16 DIAGNOSIS — J302 Other seasonal allergic rhinitis: Secondary | ICD-10-CM | POA: Diagnosis not present

## 2023-01-16 DIAGNOSIS — K5909 Other constipation: Secondary | ICD-10-CM | POA: Diagnosis not present

## 2023-01-16 DIAGNOSIS — F339 Major depressive disorder, recurrent, unspecified: Secondary | ICD-10-CM | POA: Diagnosis not present

## 2023-01-16 DIAGNOSIS — G43909 Migraine, unspecified, not intractable, without status migrainosus: Secondary | ICD-10-CM | POA: Diagnosis not present

## 2023-01-16 DIAGNOSIS — Z131 Encounter for screening for diabetes mellitus: Secondary | ICD-10-CM | POA: Diagnosis not present

## 2023-01-16 DIAGNOSIS — G8929 Other chronic pain: Secondary | ICD-10-CM | POA: Diagnosis not present

## 2023-01-16 DIAGNOSIS — M329 Systemic lupus erythematosus, unspecified: Secondary | ICD-10-CM | POA: Diagnosis not present

## 2023-01-30 DIAGNOSIS — R7303 Prediabetes: Secondary | ICD-10-CM | POA: Diagnosis not present

## 2023-01-30 DIAGNOSIS — M329 Systemic lupus erythematosus, unspecified: Secondary | ICD-10-CM | POA: Diagnosis not present

## 2023-01-30 DIAGNOSIS — E559 Vitamin D deficiency, unspecified: Secondary | ICD-10-CM | POA: Diagnosis not present

## 2023-01-30 DIAGNOSIS — G43909 Migraine, unspecified, not intractable, without status migrainosus: Secondary | ICD-10-CM | POA: Diagnosis not present

## 2023-01-30 DIAGNOSIS — F339 Major depressive disorder, recurrent, unspecified: Secondary | ICD-10-CM | POA: Diagnosis not present

## 2023-01-30 DIAGNOSIS — F5104 Psychophysiologic insomnia: Secondary | ICD-10-CM | POA: Diagnosis not present

## 2023-01-30 DIAGNOSIS — E7800 Pure hypercholesterolemia, unspecified: Secondary | ICD-10-CM | POA: Diagnosis not present

## 2023-01-30 DIAGNOSIS — D649 Anemia, unspecified: Secondary | ICD-10-CM | POA: Diagnosis not present

## 2023-01-30 DIAGNOSIS — J302 Other seasonal allergic rhinitis: Secondary | ICD-10-CM | POA: Diagnosis not present

## 2023-01-30 DIAGNOSIS — G8929 Other chronic pain: Secondary | ICD-10-CM | POA: Diagnosis not present

## 2023-03-06 DIAGNOSIS — R600 Localized edema: Secondary | ICD-10-CM | POA: Diagnosis not present

## 2023-03-06 DIAGNOSIS — G43909 Migraine, unspecified, not intractable, without status migrainosus: Secondary | ICD-10-CM | POA: Diagnosis not present

## 2023-03-06 DIAGNOSIS — E559 Vitamin D deficiency, unspecified: Secondary | ICD-10-CM | POA: Diagnosis not present

## 2023-03-06 DIAGNOSIS — R7303 Prediabetes: Secondary | ICD-10-CM | POA: Diagnosis not present

## 2023-03-06 DIAGNOSIS — E7800 Pure hypercholesterolemia, unspecified: Secondary | ICD-10-CM | POA: Diagnosis not present

## 2023-03-06 DIAGNOSIS — D649 Anemia, unspecified: Secondary | ICD-10-CM | POA: Diagnosis not present

## 2023-03-06 DIAGNOSIS — F339 Major depressive disorder, recurrent, unspecified: Secondary | ICD-10-CM | POA: Diagnosis not present

## 2023-03-06 DIAGNOSIS — M329 Systemic lupus erythematosus, unspecified: Secondary | ICD-10-CM | POA: Diagnosis not present

## 2023-03-06 DIAGNOSIS — J302 Other seasonal allergic rhinitis: Secondary | ICD-10-CM | POA: Diagnosis not present

## 2023-03-06 DIAGNOSIS — F5104 Psychophysiologic insomnia: Secondary | ICD-10-CM | POA: Diagnosis not present

## 2023-03-06 DIAGNOSIS — G8929 Other chronic pain: Secondary | ICD-10-CM | POA: Diagnosis not present

## 2023-03-22 DIAGNOSIS — D508 Other iron deficiency anemias: Secondary | ICD-10-CM | POA: Diagnosis not present

## 2023-03-22 DIAGNOSIS — E559 Vitamin D deficiency, unspecified: Secondary | ICD-10-CM | POA: Diagnosis not present

## 2023-03-22 DIAGNOSIS — R110 Nausea: Secondary | ICD-10-CM | POA: Diagnosis not present

## 2023-03-22 DIAGNOSIS — G8929 Other chronic pain: Secondary | ICD-10-CM | POA: Diagnosis not present

## 2023-03-22 DIAGNOSIS — F339 Major depressive disorder, recurrent, unspecified: Secondary | ICD-10-CM | POA: Diagnosis not present

## 2023-03-22 DIAGNOSIS — E7800 Pure hypercholesterolemia, unspecified: Secondary | ICD-10-CM | POA: Diagnosis not present

## 2023-03-22 DIAGNOSIS — F5104 Psychophysiologic insomnia: Secondary | ICD-10-CM | POA: Diagnosis not present

## 2023-03-22 DIAGNOSIS — J302 Other seasonal allergic rhinitis: Secondary | ICD-10-CM | POA: Diagnosis not present

## 2023-03-22 DIAGNOSIS — G43909 Migraine, unspecified, not intractable, without status migrainosus: Secondary | ICD-10-CM | POA: Diagnosis not present

## 2023-03-22 DIAGNOSIS — M329 Systemic lupus erythematosus, unspecified: Secondary | ICD-10-CM | POA: Diagnosis not present

## 2023-03-22 DIAGNOSIS — R7303 Prediabetes: Secondary | ICD-10-CM | POA: Diagnosis not present

## 2023-08-08 IMAGING — CT CT ABD-PELV W/ CM
2 of 5 series · 15 of 46 positions shown, 17 images · IV contrast (APPLIED)
Comparison: CT abdomen pelvis dated 02/15/2019.

CLINICAL DATA: Abdominal pain.

EXAM:
CT ABDOMEN AND PELVIS WITH CONTRAST
TECHNIQUE: Multidetector CT imaging of the abdomen and pelvis was performed
using the standard protocol following bolus administration of
intravenous contrast.

[Series 2: abd pel w · axial · 0.61mm/px · z∈[-332,+42]mm · 12 of 85 slices shown, 14 images]
[im 5/85  soft-tissue]
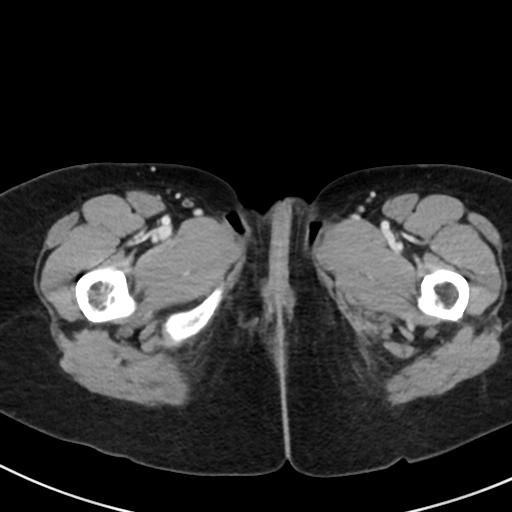
[im 5/85  bone]
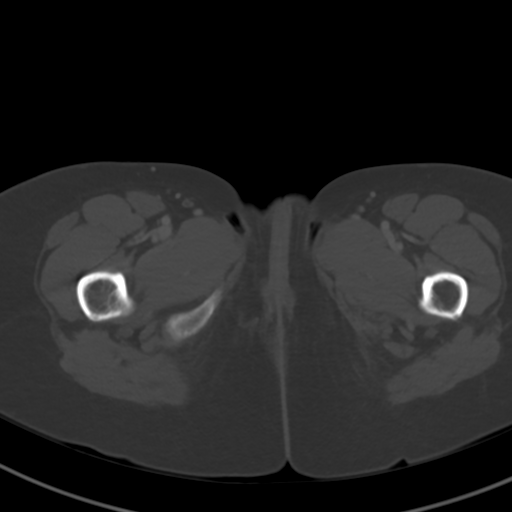
[im 15/85  soft-tissue]
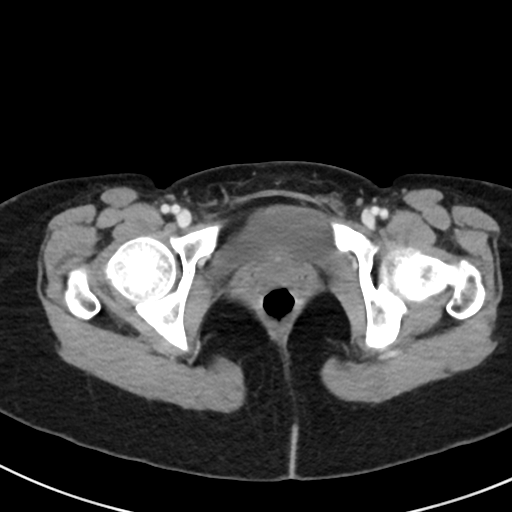
[im 19/85  soft-tissue]
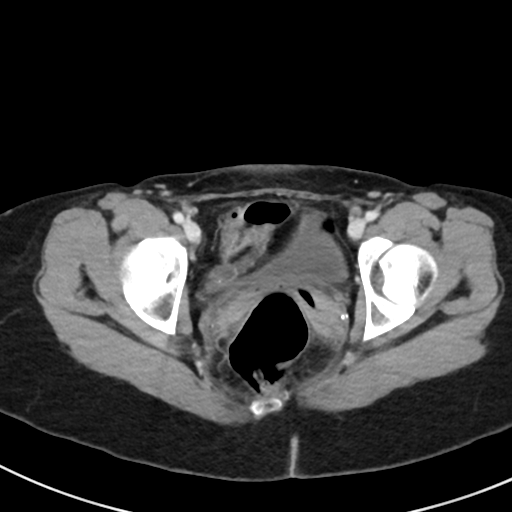
[im 24/85  soft-tissue]
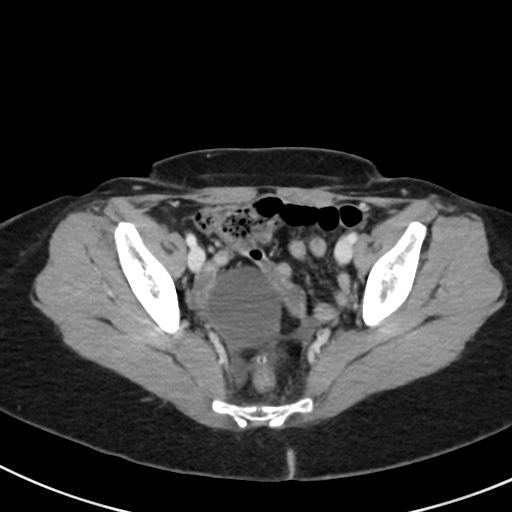
[im 33/85  soft-tissue]
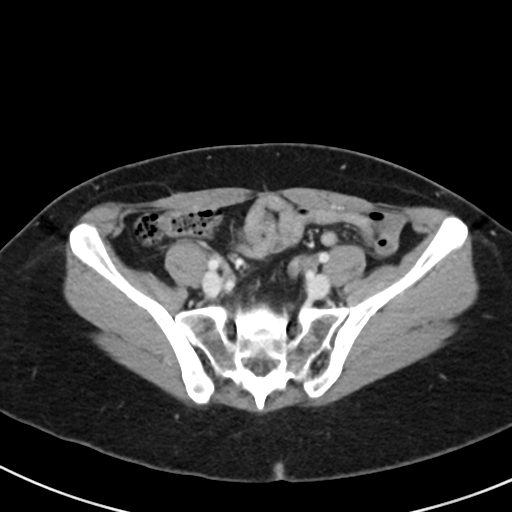
[im 38/85  soft-tissue]
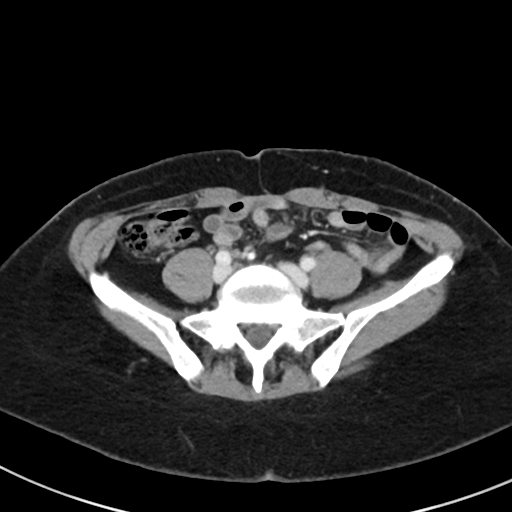
[im 47/85  soft-tissue]
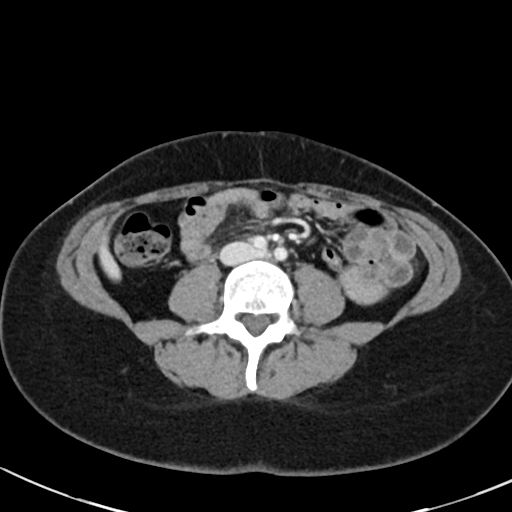
[im 52/85  soft-tissue]
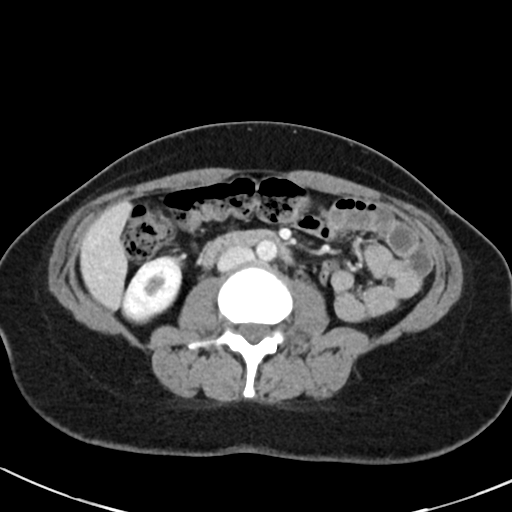
[im 61/85  soft-tissue]
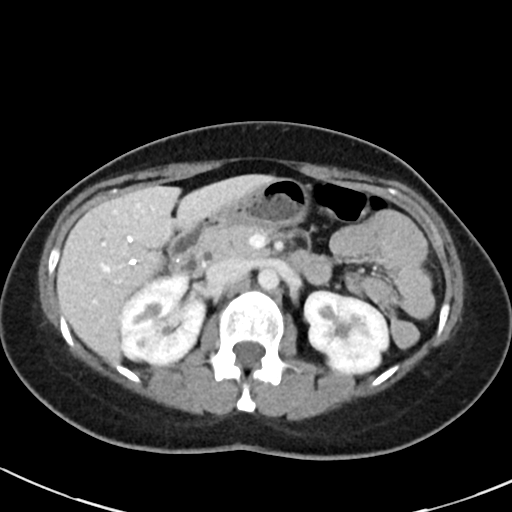
[im 61/85  bone]
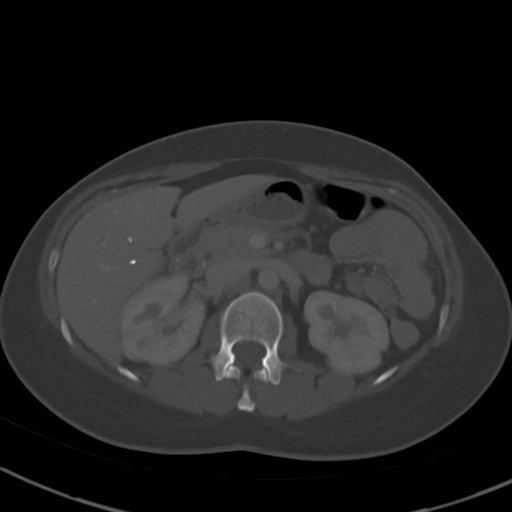
[im 66/85  soft-tissue]
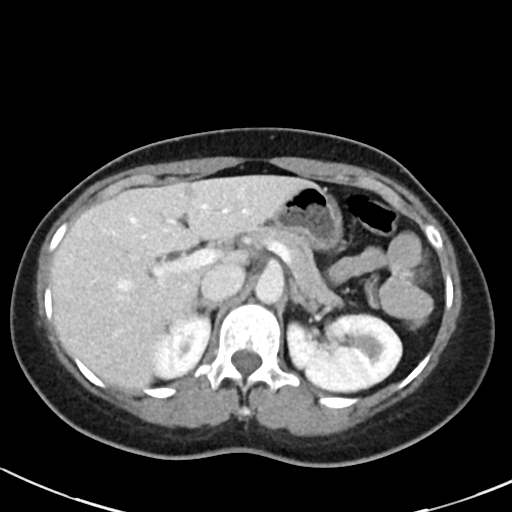
[im 71/85  soft-tissue]
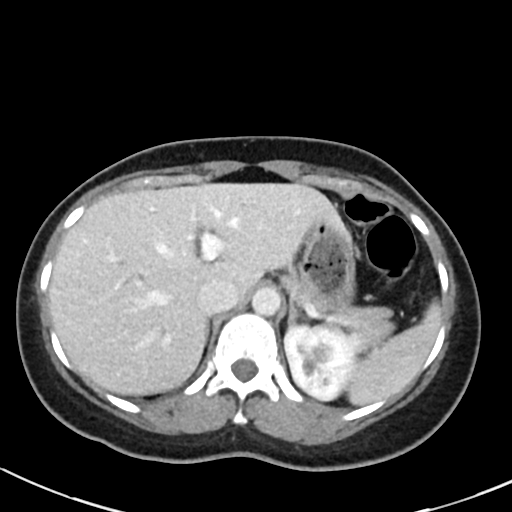
[im 80/85  soft-tissue]
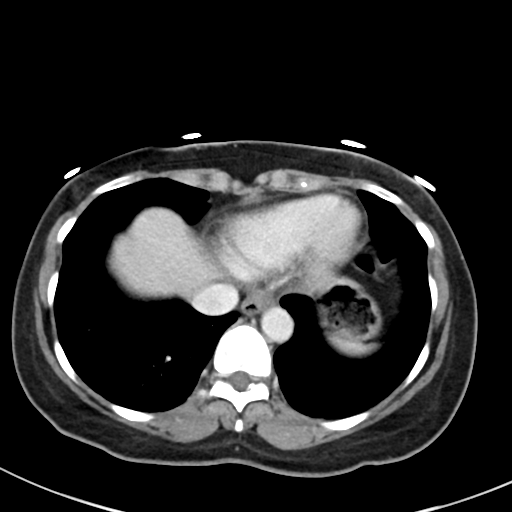

[Series 5: coronal · coronal · 0.63mm/px · 3 of 75 slices shown]
[im 25/75  soft-tissue]
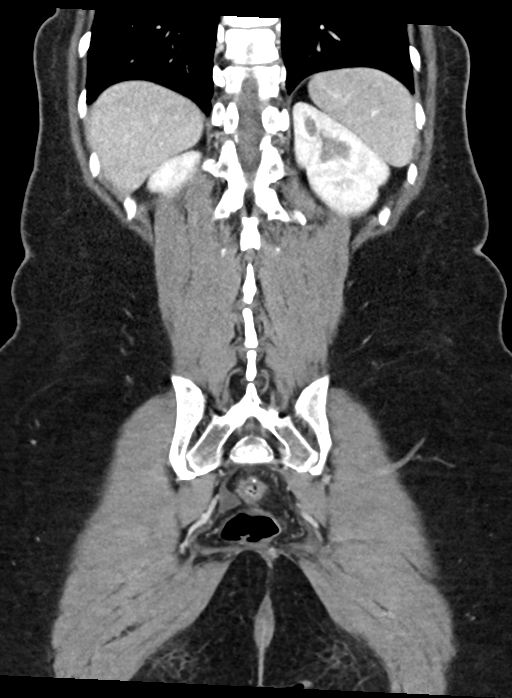
[im 33/75  soft-tissue]
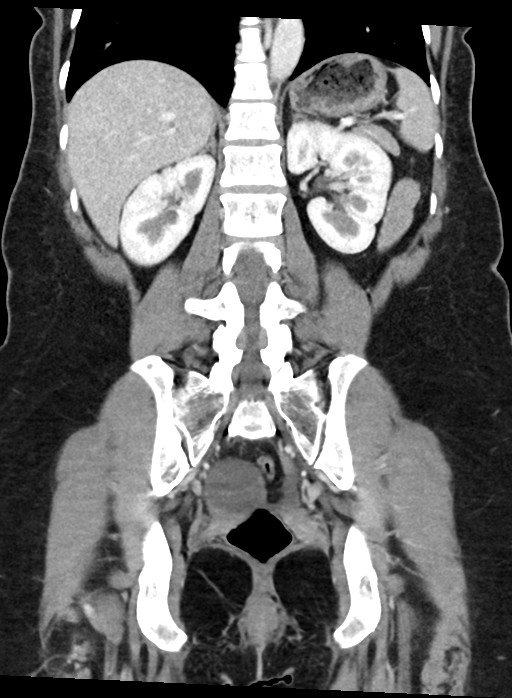
[im 42/75  soft-tissue]
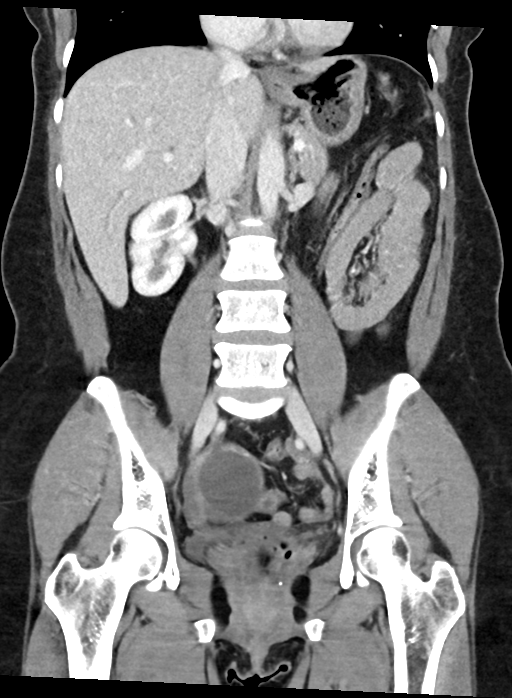

[15 of 46 positions shown; findings below may reference images not displayed]

RADIATION DOSE REDUCTION: This exam was performed according to the
departmental dose-optimization program which includes automated
exposure control, adjustment of the mA and/or kV according to
patient size and/or use of iterative reconstruction technique.

CONTRAST:  75mL OMNIPAQUE IOHEXOL 300 MG/ML  SOLN
FINDINGS: Lower chest: The visualized lung bases are clear.

No intra-abdominal free air.  Trace free fluid within the pelvis.

Hepatobiliary: The liver is unremarkable. Mild intrahepatic biliary
dilatation, likely post cholecystectomy. No retained calcified stone
noted in the central CBD.

Pancreas: Unremarkable. No pancreatic ductal dilatation or
surrounding inflammatory changes.

Spleen: Normal in size without focal abnormality.

Adrenals/Urinary Tract: The adrenal glands unremarkable. The
kidneys, visualized ureters, and urinary bladder appear
unremarkable.

Stomach/Bowel: There is no bowel obstruction. Postsurgical changes
of the bowel with anastomotic suture in the region of the
rectosigmoid. The appendix is enlarged and fluid filled measuring up
to 15 mm in diameter. Several stones noted within the appendix and
obstructing the base of the appendix. There is mild inflammatory
changes of appendix. The appendix is located in the right hemipelvis
posterior to the cecum and superior to the bladder dome. No evidence
of perforation or abscess.

Vascular/Lymphatic: The abdominal aorta and IVC are unremarkable. No
portal venous gas. There is no adenopathy.

Reproductive: Hysterectomy. There is a 5.2 x 4.6 cm cystic structure
in the region of the right adnexa adjacent to the appendix. Because
this lesion is not adequately characterized, prompt US is
recommended for further evaluation. Note: This recommendation does
not apply to premenarchal patients and to those with increased risk
(genetic, family history, elevated tumor markers or other high-risk
factors) of ovarian cancer. Reference: JACR [DATE]):248-254

Other: Anterior pelvic wall surgical scar.

Musculoskeletal: No acute or significant osseous findings.
IMPRESSION: 1. Acute appendicitis. No evidence of perforation or abscess.
2. A 5.2 x 4.6 cm cystic structure in the region of the right adnexa
adjacent to the appendix. Because this lesion is not adequately
characterized, prompt US is recommended for further evaluation.

## 2023-08-31 IMAGING — US US PELVIS COMPLETE WITH TRANSVAGINAL
1 series · 13 of 25 positions shown · non-contrast
Comparison: 12/08/2016

Imaging correlation: CT abdomen and pelvis 09/14/2021

CLINICAL DATA: Acute pelvic pain, appendectomy 3 weeks ago

EXAM:
TRANSABDOMINAL AND TRANSVAGINAL ULTRASOUND OF PELVIS
TECHNIQUE: Both transabdominal and transvaginal ultrasound examinations of the
pelvis were performed. Transabdominal technique was performed for
global imaging of the pelvis including uterus, ovaries, adnexal
regions, and pelvic cul-de-sac. It was necessary to proceed with
endovaginal exam following the transabdominal exam to visualize the
ovaries and adnexa.

[Series 1: us pelvic complete with transvaginal · 13 of 75 slices shown]
[im 1/75]
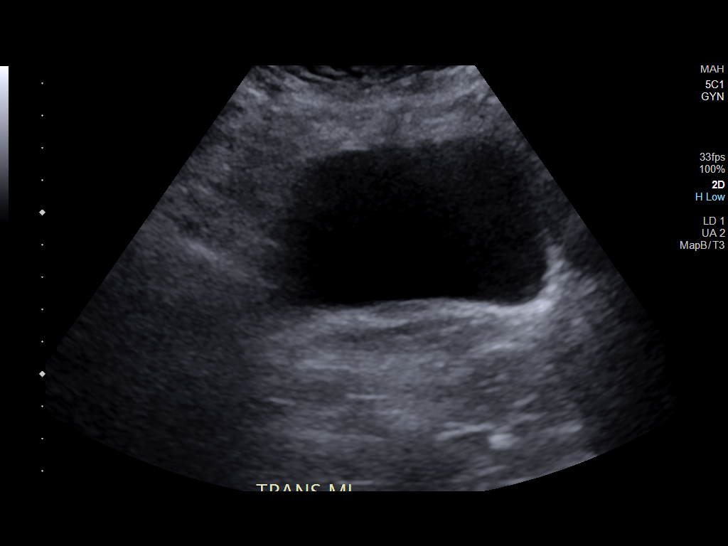
[im 7/75]
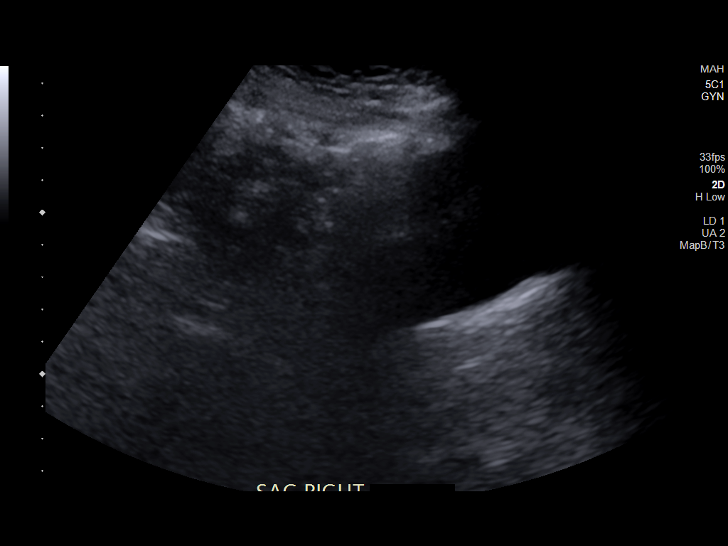
[im 13/75]
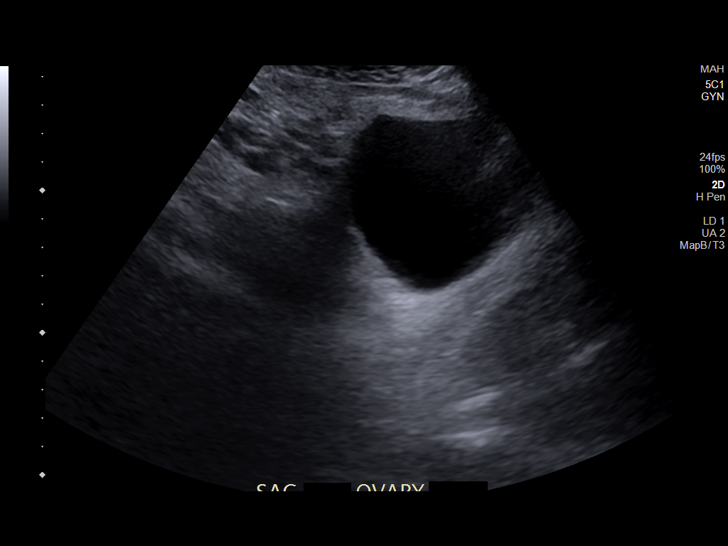
[im 19/75]
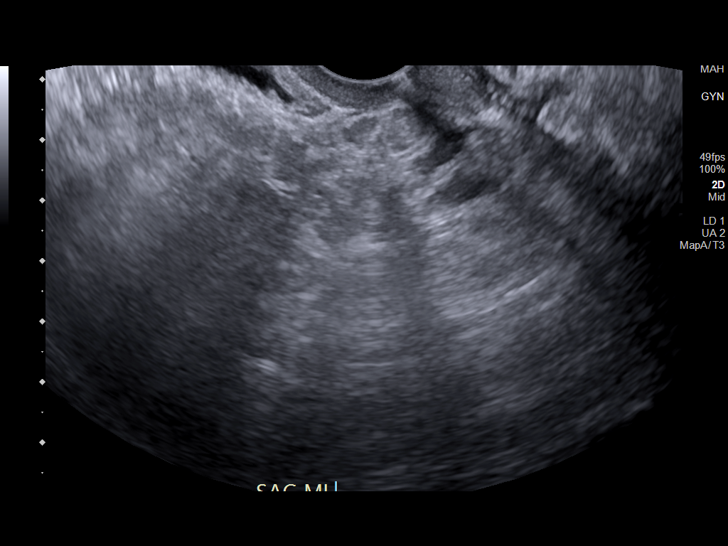
[im 25/75]
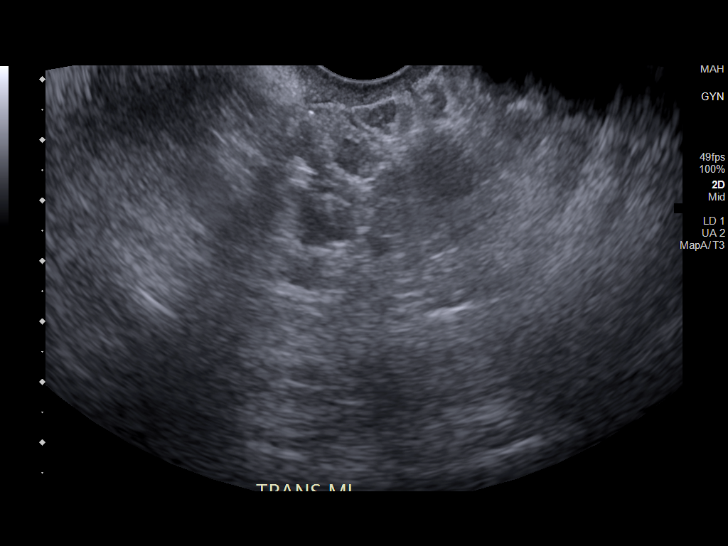
[im 31/75]
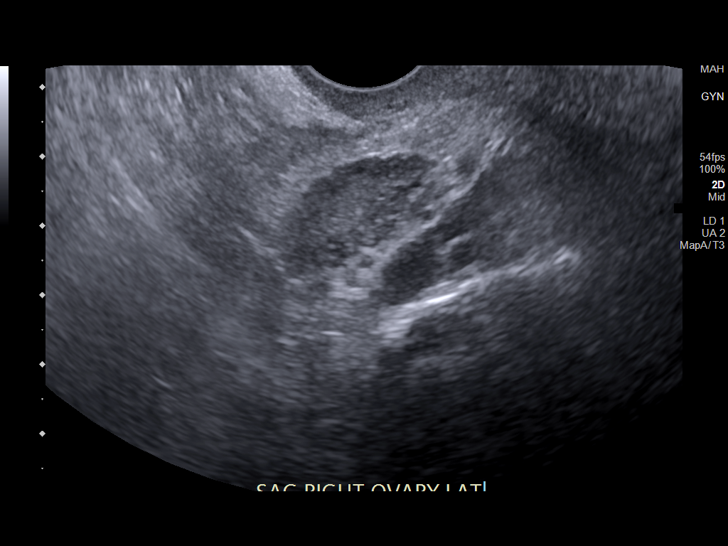
[im 38/75]
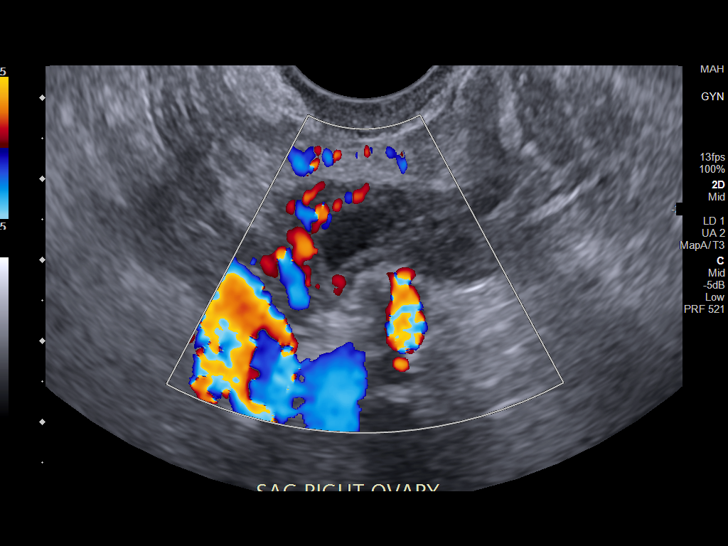
[im 44/75]
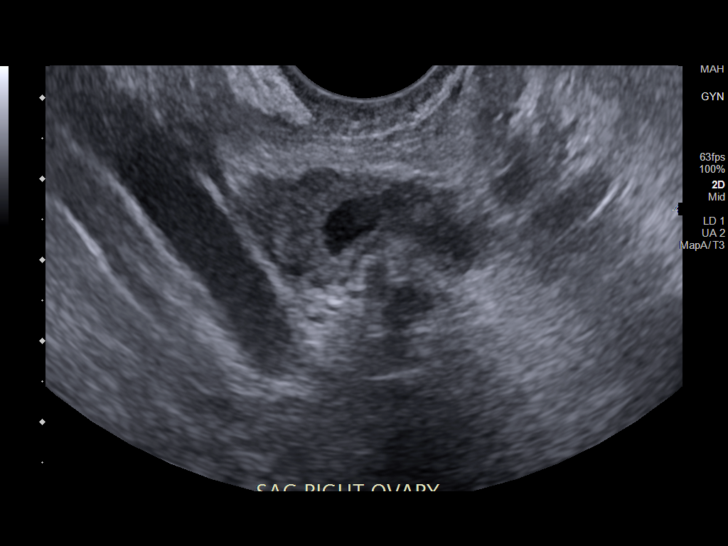
[im 50/75]
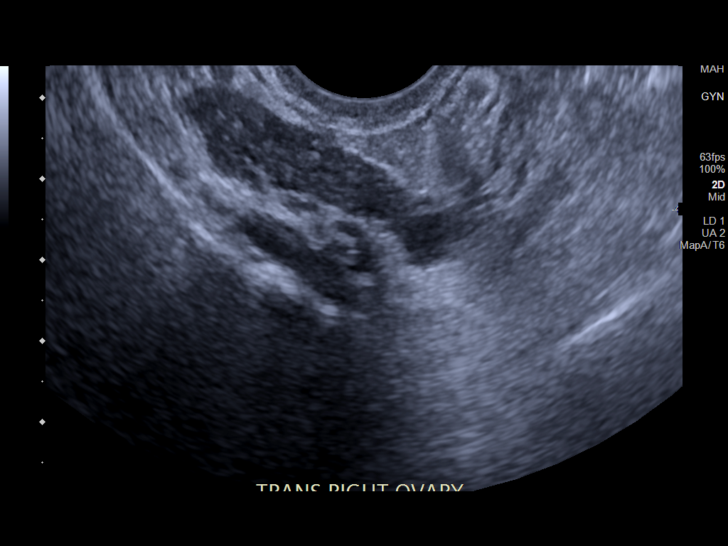
[im 56/75]
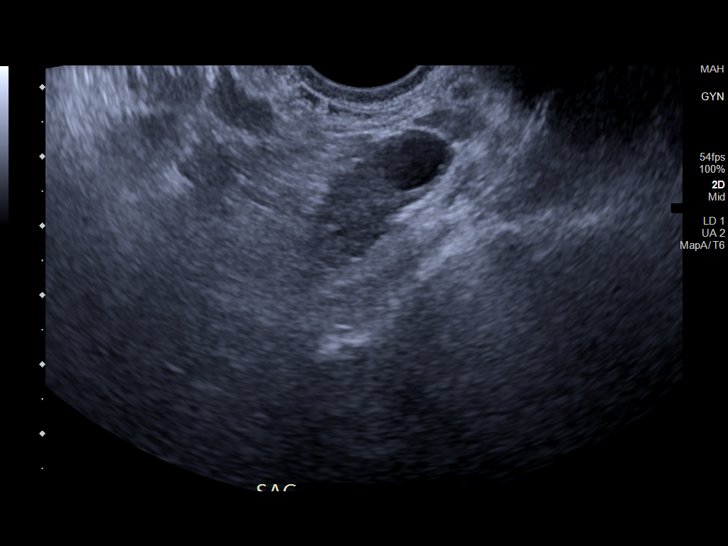
[im 62/75]
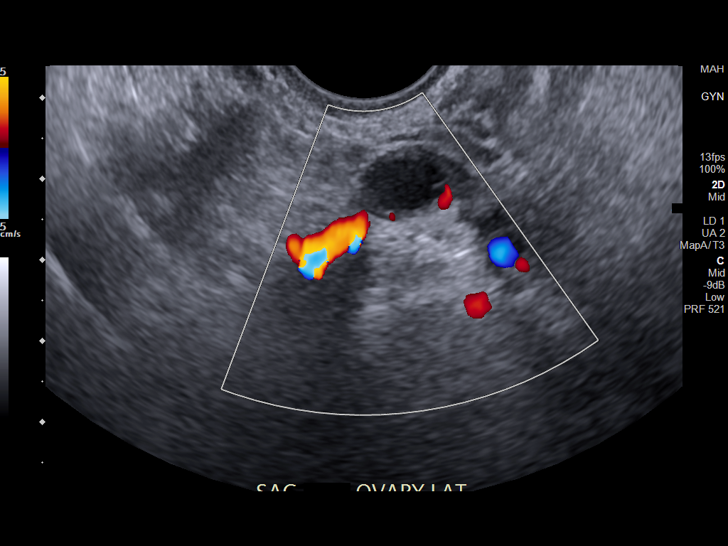
[im 68/75]
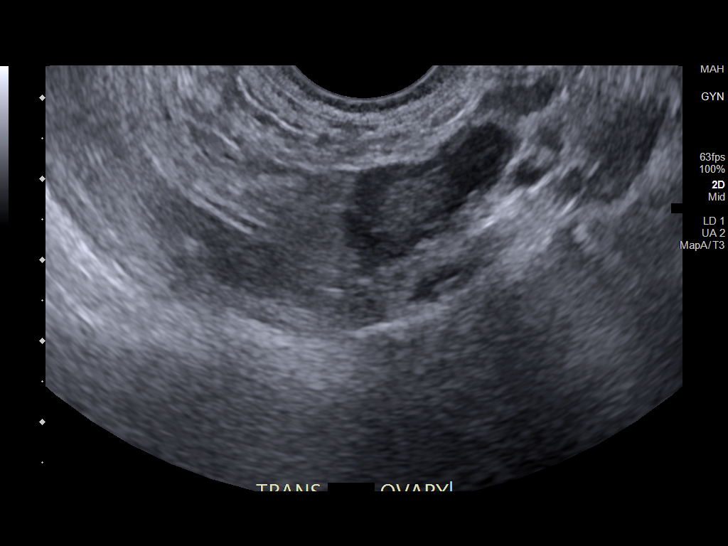
[im 75/75]
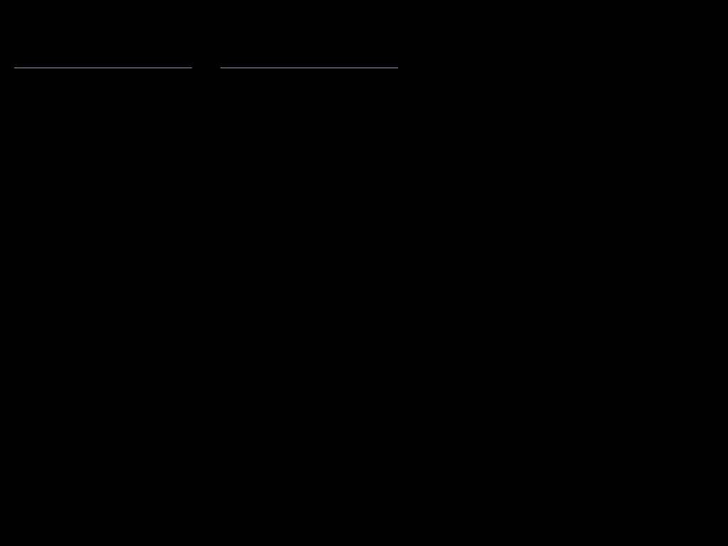

[13 of 25 positions shown; findings below may reference images not displayed]

FINDINGS: Uterus

Surgically absent

Endometrium

Surgically absent

Right ovary

Measurements: 3.0 x 1.5 x 2.0 cm = volume: 4.8 mL. Tiny cyst RIGHT
ovary 11 mm diameter. Additional ovarian versus paraovarian cyst 10
mm diameter, complicated by a few scattered internal echoes.

Left ovary

Measurements: 3.2 x 1.4 x 1.4 cm = volume: 3.2 mL. Small simple cyst
within LEFT ovary 10 mm greatest diameter. No additional masses.

Other findings

No free pelvic fluid or additional pelvic Mass.
IMPRESSION: Post hysterectomy.

Small cysts within the ovaries bilaterally, may represent follicles
or small cysts up to 11 mm diameter; no follow-up imaging
recommended.

No additional pelvic sonographic abnormalities.

Specifically, the 5.2 cm diameter cyst within the RIGHT ovary seen
on the recent CT is no longer identified.
